# Patient Record
Sex: Female | Born: 1945 | Race: White | Hispanic: No | Marital: Married | Smoking: Former smoker
Health system: Southern US, Community
[De-identification: ages and names within clinical notes are randomized; demographics above are authoritative.]

## PROBLEM LIST (undated history)

## (undated) DIAGNOSIS — Z87442 Personal history of urinary calculi: Secondary | ICD-10-CM

## (undated) DIAGNOSIS — N390 Urinary tract infection, site not specified: Secondary | ICD-10-CM

## (undated) DIAGNOSIS — Z5189 Encounter for other specified aftercare: Secondary | ICD-10-CM

## (undated) DIAGNOSIS — I639 Cerebral infarction, unspecified: Secondary | ICD-10-CM

## (undated) DIAGNOSIS — R112 Nausea with vomiting, unspecified: Secondary | ICD-10-CM

## (undated) DIAGNOSIS — R51 Headache: Secondary | ICD-10-CM

## (undated) DIAGNOSIS — G4730 Sleep apnea, unspecified: Secondary | ICD-10-CM

## (undated) DIAGNOSIS — T8859XA Other complications of anesthesia, initial encounter: Secondary | ICD-10-CM

## (undated) DIAGNOSIS — R519 Headache, unspecified: Secondary | ICD-10-CM

## (undated) DIAGNOSIS — M1990 Unspecified osteoarthritis, unspecified site: Secondary | ICD-10-CM

## (undated) DIAGNOSIS — J189 Pneumonia, unspecified organism: Secondary | ICD-10-CM

## (undated) DIAGNOSIS — I499 Cardiac arrhythmia, unspecified: Secondary | ICD-10-CM

## (undated) DIAGNOSIS — T4145XA Adverse effect of unspecified anesthetic, initial encounter: Secondary | ICD-10-CM

## (undated) DIAGNOSIS — Z8489 Family history of other specified conditions: Secondary | ICD-10-CM

## (undated) DIAGNOSIS — N200 Calculus of kidney: Secondary | ICD-10-CM

## (undated) DIAGNOSIS — D649 Anemia, unspecified: Secondary | ICD-10-CM

## (undated) DIAGNOSIS — T7840XA Allergy, unspecified, initial encounter: Secondary | ICD-10-CM

## (undated) DIAGNOSIS — F419 Anxiety disorder, unspecified: Secondary | ICD-10-CM

## (undated) DIAGNOSIS — I4891 Unspecified atrial fibrillation: Secondary | ICD-10-CM

## (undated) DIAGNOSIS — H269 Unspecified cataract: Secondary | ICD-10-CM

## (undated) DIAGNOSIS — Z9889 Other specified postprocedural states: Secondary | ICD-10-CM

## (undated) DIAGNOSIS — I10 Essential (primary) hypertension: Secondary | ICD-10-CM

## (undated) DIAGNOSIS — E119 Type 2 diabetes mellitus without complications: Secondary | ICD-10-CM

## (undated) DIAGNOSIS — K219 Gastro-esophageal reflux disease without esophagitis: Secondary | ICD-10-CM

## (undated) DIAGNOSIS — E079 Disorder of thyroid, unspecified: Secondary | ICD-10-CM

## (undated) HISTORY — DX: Gastro-esophageal reflux disease without esophagitis: K21.9

## (undated) HISTORY — DX: Type 2 diabetes mellitus without complications: E11.9

## (undated) HISTORY — PX: DILATION AND CURETTAGE OF UTERUS: SHX78

## (undated) HISTORY — PX: TONSILLECTOMY: SUR1361

## (undated) HISTORY — DX: Anxiety disorder, unspecified: F41.9

## (undated) HISTORY — PX: TURBINATE RESECTION: SHX6158

## (undated) HISTORY — DX: Allergy, unspecified, initial encounter: T78.40XA

## (undated) HISTORY — PX: EXCISION MORTON'S NEUROMA: SHX5013

## (undated) HISTORY — PX: LITHOTRIPSY: SUR834

## (undated) HISTORY — PX: BREAST SURGERY: SHX581

## (undated) HISTORY — PX: EYE SURGERY: SHX253

## (undated) HISTORY — PX: SPINE SURGERY: SHX786

## (undated) HISTORY — DX: Unspecified cataract: H26.9

## (undated) HISTORY — DX: Sleep apnea, unspecified: G47.30

## (undated) HISTORY — PX: COSMETIC SURGERY: SHX468

## (undated) HISTORY — PX: ABDOMINAL HYSTERECTOMY: SHX81

## (undated) HISTORY — PX: LIPOMA EXCISION: SHX5283

## (undated) HISTORY — DX: Encounter for other specified aftercare: Z51.89

## (undated) HISTORY — DX: Disorder of thyroid, unspecified: E07.9

---

## 2004-08-12 ENCOUNTER — Emergency Department (HOSPITAL_COMMUNITY): Admission: EM | Admit: 2004-08-12 | Discharge: 2004-08-12 | Payer: Self-pay | Admitting: Family Medicine

## 2005-02-19 ENCOUNTER — Encounter: Admission: RE | Admit: 2005-02-19 | Discharge: 2005-02-19 | Payer: Self-pay | Admitting: Family Medicine

## 2005-03-03 ENCOUNTER — Encounter: Admission: RE | Admit: 2005-03-03 | Discharge: 2005-03-03 | Payer: Self-pay | Admitting: Family Medicine

## 2005-04-21 HISTORY — PX: JOINT REPLACEMENT: SHX530

## 2005-11-10 ENCOUNTER — Encounter: Admission: RE | Admit: 2005-11-10 | Discharge: 2005-11-10 | Payer: Self-pay | Admitting: Family Medicine

## 2006-01-22 ENCOUNTER — Encounter: Admission: RE | Admit: 2006-01-22 | Discharge: 2006-01-22 | Payer: Self-pay | Admitting: Otolaryngology

## 2006-03-05 ENCOUNTER — Encounter: Admission: RE | Admit: 2006-03-05 | Discharge: 2006-03-05 | Payer: Self-pay | Admitting: Family Medicine

## 2006-03-27 ENCOUNTER — Inpatient Hospital Stay (HOSPITAL_COMMUNITY): Admission: RE | Admit: 2006-03-27 | Discharge: 2006-03-31 | Payer: Self-pay | Admitting: Orthopedic Surgery

## 2007-03-16 ENCOUNTER — Encounter: Admission: RE | Admit: 2007-03-16 | Discharge: 2007-03-16 | Payer: Self-pay | Admitting: Family Medicine

## 2010-04-21 HISTORY — PX: BLADDER SUSPENSION: SHX72

## 2010-05-30 ENCOUNTER — Ambulatory Visit: Payer: Medicare Other | Attending: Sports Medicine | Admitting: Physical Therapy

## 2010-05-30 DIAGNOSIS — M545 Low back pain, unspecified: Secondary | ICD-10-CM | POA: Insufficient documentation

## 2010-05-30 DIAGNOSIS — IMO0001 Reserved for inherently not codable concepts without codable children: Secondary | ICD-10-CM | POA: Insufficient documentation

## 2010-05-30 DIAGNOSIS — R262 Difficulty in walking, not elsewhere classified: Secondary | ICD-10-CM | POA: Insufficient documentation

## 2010-05-30 DIAGNOSIS — R293 Abnormal posture: Secondary | ICD-10-CM | POA: Insufficient documentation

## 2010-06-04 ENCOUNTER — Ambulatory Visit: Payer: Medicare Other | Admitting: Physical Therapy

## 2010-06-07 ENCOUNTER — Ambulatory Visit: Payer: Medicare Other

## 2010-06-11 ENCOUNTER — Ambulatory Visit: Payer: Medicare Other | Admitting: Physical Therapy

## 2010-06-14 ENCOUNTER — Ambulatory Visit: Payer: Medicare Other

## 2010-06-17 ENCOUNTER — Ambulatory Visit: Payer: Medicare Other | Admitting: Physical Therapy

## 2010-06-21 ENCOUNTER — Ambulatory Visit: Payer: Medicare Other | Attending: Sports Medicine | Admitting: Physical Therapy

## 2010-06-21 DIAGNOSIS — M545 Low back pain, unspecified: Secondary | ICD-10-CM | POA: Insufficient documentation

## 2010-06-21 DIAGNOSIS — IMO0001 Reserved for inherently not codable concepts without codable children: Secondary | ICD-10-CM | POA: Insufficient documentation

## 2010-06-21 DIAGNOSIS — R262 Difficulty in walking, not elsewhere classified: Secondary | ICD-10-CM | POA: Insufficient documentation

## 2010-06-21 DIAGNOSIS — R293 Abnormal posture: Secondary | ICD-10-CM | POA: Insufficient documentation

## 2010-06-25 ENCOUNTER — Ambulatory Visit: Payer: Medicare Other | Admitting: Physical Therapy

## 2010-06-28 ENCOUNTER — Ambulatory Visit: Payer: Medicare Other | Admitting: Physical Therapy

## 2010-07-01 ENCOUNTER — Ambulatory Visit: Payer: Medicare Other | Admitting: Physical Therapy

## 2010-07-05 ENCOUNTER — Ambulatory Visit: Payer: Medicare Other

## 2010-07-08 ENCOUNTER — Ambulatory Visit: Payer: Medicare Other

## 2010-07-09 ENCOUNTER — Ambulatory Visit: Payer: Medicare Other | Admitting: Physical Therapy

## 2010-07-15 ENCOUNTER — Ambulatory Visit: Payer: Medicare Other | Admitting: Physical Therapy

## 2010-09-06 NOTE — Op Note (Signed)
NAMESANAIYA, WELLIVER                 ACCOUNT NO.:  [id number]   MEDICAL RECORD NO.:  [id number]          PATIENT TYPE:  INP   LOCATION:  0001                         FACILITY:  [facility]   PHYSICIAN:  Madlyn Frankel. Charlann Boxer, M.D.  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   PROCEDURE:  Right total hip replacement.   COMPONENTS USED:  DePuy hip system with a size 50 pinnacle cup, single  cancellous bone screw and a metal-on-metal 36 liner with a trial 11.3  lateralized offset stem with a 36 plus 1.5 ball.   SURGEON:  Madlyn Frankel. Charlann Boxer, M.D.   ASSISTANT:  Yetta Glassman. Loreta Ave, PA-C.   ANESTHESIA:  General.   ESTIMATED BLOOD LOSS:  150 mL.   DRAINS:  None.   COMPLICATIONS:  None.   INDICATIONS FOR PROCEDURE:  [name] is a 65 year old female who  presented to the office with advanced degenerative changes and failure  to respond to conservative measures. She had pain with all activities  limiting her quality of life. We discussed the risks and benefits of  nonoperative intervention at this point and she wished to proceed with  surgery. Consent was obtained after reviewing the risks of dislocation,  infection, DVT, neurologic damage, neurovascular injury.   DESCRIPTION OF PROCEDURE:  The patient was brought to the operative  theater. Once adequate anesthesia and preoperative antibiotics, 1 gram  of Ancef, was administered, the patient was positioned in the left  lateral decubitus position with the right side up. The right lower  extremity was then prepped and draped in sterile fashion following a  prescrub. A lateral based incision was made for posterior approach to  the hip. The  iliotibial band and gluteus fascia were incised in line  with the incision. She short external rotators were taken down  separately from the posterior capsule. An L capsulotomy was made in  order to expose the hip and protect the sciatic  nerve for anatomic  repair post procedure.   The hip was dislocated, a neck osteotomy was made from the center of the  head measuring 37 mm corresponding to the use of a lateralized offset  stem.   The neck osteotomy revealed severe degenerative changes of the hip with  flaps of cartilage on the head as well as a large loose body inside the  joint. Following this, femoral exposure was obtained using retractors. A  box osteotome was used to set anteversion. I then used a single axial  straight reamer, irrigated the canal to prevent fat emboli and then  began broaching. I broached all the way up to an 11.3 down to the  posterior neck cut where the measurement was taken with excellent  purchase. This was removed and the femoral canal packed.   The femur was then  retracted anteriorly and retractors for acetabular  exposure placed. Labrectomy was carried out. I then began reaming with a  45 reamer, carried it all the way up to a 49 with an excellent bony bed  preparation and a well-seated cup. She had a very neutral to almost  retroverted anatomic position and so the cup which was anteverted  forward flexed about 20 degrees and abducted about 35-40 degrees. It was  about 8 mm beneath the anterior rim.   I did place a single cancellous screw to support this excellent initial  scratch fit. A trial 36 neutral liner was placed. Attention was then  redirected back to the femur where this 11.3 broach was impacted down to  the neck cut. Trial reduction was carried out. I was very happy with the  stability of the hip and all range of motion. Her soft tissues appeared  to be lax enough that there was 2-3 mm of shuck with the hip in  extension but her hip was very  stable so I decided this going to be  length and did not need the lengthener based on the position of her leg  preop compared to now.   At this point, the trial components were all removed. The final central  hole eliminator was placed. The neutral metal on metal XL 50 mm 36  neutral metal liner was impacted into position and the final 11.3  lateralized offset stem was impacted down to the neck cut.   Following this, a trial reduction was again carried out with a 1.5 ball  and this was very good from a stability and length position as noted in  the trials.   The final 36, 1.5 ball was impacted on the clean and dry trunnion and  the hip reduced. The hip was irrigated throughout the case and again at  this point. I reapproximated the posterior capsule of the superior  leaflet using a #1 Ethibond. The #1 Ethibond was also utilized on the  iliotibial band. The gluteus fascia was reapproximated using #1 Vicryl.  The remainder of the wound was closed in layers with 2-0 Vicryl and a 4-  0 running Monocryl. The patient was brought to the recovery room in  stable condition, extubated.      Madlyn Frankel Charlann Boxer, M.D.  Electronically Signed     MDO/MEDQ  D:  03/27/2006  T:  03/27/2006  Job:  [id number]

## 2010-09-06 NOTE — H&P (Signed)
[name], [name]                 ACCOUNT NO.:  [id number]   MEDICAL RECORD NO.:  [id number]          PATIENT TYPE:   LOCATION:                                 FACILITY:   PHYSICIAN:  Connie Barnes, M.D.  DATE OF BIRTH:  1945-12-30   DATE OF ADMISSION:  03/27/2006  DATE OF DISCHARGE:                                HISTORY & PHYSICAL   Date of procedure will be March 27, 2006, for a total hip replacement.   CHIEF COMPLAINT:  Right hip pain.   HISTORY OF PRESENT ILLNESS:  [name] is a very pleasant, 65 year old lady  who was referred to [place] from Dr. __________ .  She has had a longstanding  history of right hip pain.  Prior evaluation revealed that she had advanced  degenerative joint disease with probable AVN and a medial head collapse.  The pain has progressed since 2001 and, at times, the pain is very sharp and  nearly intolerable.  She has tried some conservative measures which have  failed to relieve the pain and it seems very reasonable that she has elected  to proceed forward with a total hip replacement.   PAST HISTORY:  1. Hypertension.  2. Diverticulitis.  3. Migraine headaches.  4. Kidney stones.  5. Anxiety/depression.  6. Anemia.  7. Arthritis.   SURGICAL HISTORY:  Include:  1. Tonsillectomy in 1964.  2. Two D and C, 1968 and 1986.  3. Lipoma in 1994, removal.  4. Morton's neuroma in 2004.  5. Hysterectomy in 1986.  6. Lithotripsy 2003 and 2005.  7. Right breast biopsies in 1999.   SHE HAS HAD SOME SEVERE REACTION TO ANESTHESIA WHERE SHE CAN ACTUALLY GET  QUITE ABUSIVE VERBALLY SHE STATES UPON AWAKENING.   FAMILY HISTORY:  Significant for coronary artery disease, diabetes, and  cancer.   SOCIAL HISTORY:  She is a widow and retired and lives alone.  She did have  questions about her recovery and who was going to be there for recovery.  I  stated that it is very helpful to have somebody there to help you during the  first couple of weeks of rehab  and she objects to any kind of skilled  nursing facility.   DRUG ALLERGIES:  CIPRO CAUSES SWELLING.  TETRACYCLINE CAUSES YEAST  INFECTION.  MORPHINE CAUSES VOMITING.   MEDICATIONS:  1. Premarin 0.625 mg vaginal cream q. weekly.  2. TriCor/hydrochlorothiazide 37.5/25 p.o. daily.  3. Veramyst nasal solution daily.  4. Actonel 35 mg p.r.n.  5. An assortment of over-the-counter medications.   In the past 2 weeks, the patient states she has had no new signs or symptoms  of any cardiovascular, respiratory, genitourinary, gastrointestinal,  neurological, or musculoskeletal signs or symptoms.  Outbreak of eczema  which she is currently being treated for.   PHYSICAL EXAM:  Temperature 98.3.  Pulse 84.  Respirations 18.  Blood  pressure 134/74.  GENERAL:  Awake, alert, and oriented, well-developed, well-nourished in no  acute distress.  NECK:  Supple.  No carotid bruits.  No lymphadenopathy.  CHEST:  Lungs are clear  to auscultation bilaterally.  BREASTS:  Deferred.  HEART:  Regular rate and rhythm without gallops, clicks, rubs, or murmurs.  ABDOMEN:  Soft, nontender, nondistended.  Bowel sounds x4.  GENITOURINARY:  Deferred.  EXTREMITIES:  She walks with a Trendelenburg.  She has increased pain with  internal rotation and decreasing range of motion to 90.  SKIN:  No skin breakdown.  Eczema which is currently being treated for.  NEUROLOGIC:  Deep tendon reflexes 2+, intact distal sensibly and dorsalis  pedis pulse is intact.   Labs are pending her [name] appointment.  X-rays of pelvis and hip have  already been taken.  Chest x-ray pending preop.   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   Plan of action is a right total hip replacement by surgeon, Dr. Raylene Everts.  All risks and complications were discussed.  The patient is very  inquisitive, had many questions regarding the procedure treatment and  postoperative care.  All  questions were encouraged, answered, and reviewed  to the patient's satisfaction.  Was given the number for [name] for  preop medical work and also requested [name] from her primary  care physician.  Look forward to treating this very motivated, inquisitive,  and curious woman.     ______________________________  Connie Barnes. Connie Barnes, Connie Barnes      Connie Barnes, M.D.  Electronically Signed    BLM/MEDQ  D:  03/06/2006  T:  03/06/2006  Job:  [id number]

## 2010-09-06 NOTE — Consult Note (Signed)
NAME:  Connie Barnes, Connie Barnes                 ACCOUNT NO.:  [id number]   MEDICAL RECORD NO.:  [id number]          PATIENT TYPE:  INP   LOCATION:  1606                         FACILITY:  [facility]   PHYSICIAN:  Mobolaji B. Bakare, M.D.DATE OF BIRTH:  1945/05/16   DATE OF CONSULTATION:  03/31/2006  DATE OF DISCHARGE:                                 CONSULTATION   PRIMARY CARE PHYSICIAN:  Dr. Bradd Canary.   REASON FOR REFERRAL:  Left lower lobe pneumonia.   HISTORY OF PRESENTING COMPLAINT:  Connie Barnes is a pleasant 65 year old  Caucasian female who underwent right total hip replacement on the 7th of  December, 2007.  She was admitted on the same day.  Patient was well  prior to procedure and she is still well now.  She denies any shortness  of breath, cough or difficulty with breathing on exertion.  She has been  ambulating post procedure and she has not experienced any shortness of  breath.  There is no fever, temperature has been within normal except  for low grade temp of 99.2 and O2 sats have been normal, ranging between  95-97%.   Patient had an admission chest x-ray which showed a rounded density in  the medial right lung base, likely epicardial fat.  A CT scan was  recommended.  A follow up CT scan of the chest showed incidental finding  of left lower lobe pneumonia and mediastinal adenopathy.  Hence,  [facility] was called for recommendation regarding treatment.   REVIEW OF SYSTEMS:  As in the HPI.  In addition, patient has no  diarrhea.  She has been constipated but this seems to be improving now.  She has no significant pain at the site of surgery.  No headaches.   PAST MEDICAL HISTORY:  Significant for:  1. Hypertension.  2. Diverticulitis.  3. Migraine headaches.  4. Kidney stones.  5. Anxiety.  6. Depression.  7. Anemia.  8. Arthritis.   PAST SURGICAL HISTORY:  1. Right breast biopsy in 1999.  2. Lithotripsy in __________ and 2005.  3. Hysterectomy in 1986.  4. Removal of  lipoma in 1994.  5. D&C in 1968 and 1986.  6. Tonsillectomy in 1964.  7. Surgery for Morton's neuroma in 2004.   CURRENT MEDICATIONS:  1. Premarin 0.625 mg daily.  2. Colace 100 mg b.i.d.  3. Lovenox 40 mg daily.  4. Ferrous sulfate 325 mg t.i.d.  5. Premarin per vagina weekly.  6. Actonel 35 mg weekly.  7. Maxzide one tablet daily.   ALLERGIES:  1. SHE IS ALLERGIC TO CIPROFLOXACIN WITH HIVES.  2. MORPHINE CAUSES NAUSEA AND VOMITING.  3. TETRACYCLINE CAUSED YEAST INFECTION.  4. TALWIN GAVE HER HALLUCINATIONS.   SOCIAL HISTORY:  Patient is a widow and she lives alone.  She smoked  cigarettes 1/2 to 3/4 pack per day and she smoked for 15-20 years.  She  quit smoking 3 years ago.  Patient occasionally drinks alcohol about 2-3  glasses per week.   CURRENT VITALS:  Temperature 99.2, pulse of 77, respiratory rate of 20,  blood pressure of 137/67,  O2 sats of 95% on room air.   EXAMINATION:  Patient is comfortable, not in respiratory distress.  Normocephalic, atraumatic head.  Pupils equal, round and reactive to  light.  Extraocular muscle movements intact.  Mucous membrane is moist,  no oral thrush.  LUNGS:  Clear clinically to auscultation.  CVS:  S1, S2 regular, no murmur, no gallop.  ABDOMEN:  Not distended, soft, nontender.  Bowel sounds present.  EXTREMITIES:  No pedal edema.  MUSCULOSKELETAL:  Bruise right hip surgical site.  CNS:  No focal neurological deficit.   LABORATORY DATA:  Hemoglobin and hematocrit 10.8/31.  No current CBC  available.  CT scan of the chest shows right cardiophrenic angle opacity  noted on previous chest x-ray representing benign epicardial fat pad.  Incidental finding of left lower lobe pneumonia and mild mediastinal  adenopathy.   ASSESSMENT AND RECOMMENDATION:  Connie Barnes is a 65 year old Caucasian  female who was hospitalized on the 7th of December.  She had a CT scan  to further evaluate chest x-ray finding of rounded density in the medial   lung base.  The CT scan showed incidental finding of left lower lobe  pneumonia.  It is hard to tell if this was pre-hospitalization or  hospital acquired pneumonia, as patient did not have symptoms prior to  hospitalization and currently has no symptoms.  She is suitable for  outpatient treatment of pneumonia.  She would have benefited from  fluoroquinolone which would cover Pseudomonas as well.  However, PATIENT  IS ALLERGIC TO CIPRO.  Will treat her with high dose Augmentin 875 mg by  mouth twice daily for one week.  I have instructed her to call Dr. Artis Flock  if she develops fever, chills or becomes symptomatic.  She can also come  to the emergency room.  Will draw  baseline CBC and differential and also two sets of blood cultures.  Patient can be discharged home today at the discretion of Dr. Charlann Boxer.  She  can be treated for pneumonia as an outpatient.   Thank you for the consult.      Mobolaji B. Corky Downs, M.D.  Electronically Signed     MBB/MEDQ  D:  03/31/2006  T:  03/31/2006  Job:  [id number]   cc:   Madlyn Frankel. Charlann Boxer, M.D.  Fax: [phone]   Quita Skye. Artis Flock, M.D.  Fax: [phone]

## 2010-09-06 NOTE — Discharge Summary (Signed)
NAMEHELVI, ROYALS                 ACCOUNT NO.:  [id number]   MEDICAL RECORD NO.:  [id number]          PATIENT TYPE:  INP   LOCATION:  1606                         [facility]ACILITY:  [facility]   PHYSICIAN:  Madlyn Frankel. Charlann Boxer, M.D.  DATE OF BIRTH:  [birth year]   DATE OF ADMISSION:  03/27/2006  DATE OF DISCHARGE:  03/31/2006                               DISCHARGE SUMMARY   ADMISSION DIAGNOSIS:  1. Hypertension.  2. Diverticulitis.  3. Migraines.  4. Kidney stones.  5. Anxiety and depression.  6. Anemia.  7. Osteoarthritis.   DISCHARGE DIAGNOSIS:  1. Hypertension.  2. Diverticulitis.  3. Migraines.  4. Kidney stones.  5. Anxiety and depression.  6. Anemia.  7. Osteoarthritis.  8. Postoperative anemia.  9. Left lower lobe pneumonia.   PAST SURGICAL HISTORY:  1. Tonsillectomy in 1964.  2. D&C in 1962 and 1986.  3. Lipoma in 1994 removal.  4. Morton's neuroma in 2004.  5. Hysterectomy in 1986.  6. Lithotripsy in 2003 and 2005.  7. Right breast biopsy in 1999.   CONSULTATIONS:  [facility] Hospitalist due to mass found on CT and left  lower lobe.  She was seen by [facility] who treated her for  her left lower lobe pneumonia.   Procedure was a right total hip replacement.  Surgeon was Dr. Durene Romans.  Assistant was [facility], [place].   LABORATORY DATA:  Preadmission CBC showed hemoglobin 13.9, hematocrit  40.8 and dropped throughout her course of stay hemoglobin 9.5,  hematocrit 27.4.  On  March 29, 2006 hemoglobin 8.7, hematocrit 24.9.  On March 30, 2006 hemoglobin 7.9, hematocrit 22.4.  She was  transfused two units of packed red blood cells.  Subsequent checking of  her blood showed her hemoglobin and hematocrit 10.8 and 31 upon  discharge.  Hemoglobin and hematocrit was 11.2 and 32.6 respectively.  Preadmission white cell differential within normal limits.  Differential  upon discharge within normal limits.  Preadmission coagulation within  normal limits.   Preadmission routine chemistry showed sodium 142,  potassium 3.3, glucose 129.  Upon discharge sodium 140, potassium 3.4,  glucose 118.  Preadmission urinalysis negative.   DIAGNOSTIC DATA:  EKG showed normal sinus rhythm.  Chest two view  presurgical showed a rounded density in the medial right lung base  likely epicardial fat but recommended chest CT for confirmation.  Otherwise lungs were clear.  Heart size was normal.  Subsequent  postoperative pelvis showed satisfactory right hip placement.  CT scan  postoperatively showed a left lower lobe pneumonia.   HOSPITAL COURSE:  The patient had right total hip replacement and  tolerated the procedure well and was admitted to the orthopedic floor.  On postoperative day #1 was doing well.  No respiratory distress.  The  pain was well controlled.  Oral antibiotics were given.  Dressing was  clean, dry, and intact.  She was neuromuscularly and vascularly intact.  Physical therapy and occupational therapy and was to be weightbearing as  tolerated with use of rolling walker.   On postoperative day #2 the patient was doing  well.  Hemodynamically  remaining stable.  Dressing was clean, dry, and intact.  She had bowel  movement after postoperative day #2.  Postoperative day #3 woke up and  the patient's hemoglobin was 7.9.  Therefore, she was transfused two  units of packed red blood cells so unable to discharge postoperative day  #3 as we were waiting for normalization of her packed red blood cells.  A CT was performed.  By postoperative day #4 results showed a left lower  lobe pneumonia where we called in for a consult from [facility] who came and saw the patient.  [facility] treated the  patient for a left lower lobe pneumonia and provided a prescription for  Augmentin.  Otherwise, orthopedically she was stable.  She did have some  ecchymosis due to the Lovenox which she was taking for anticoagulation.  Otherwise she was  orthopedically stable and ready to be discharged home  with [facility].   DISPOSITION:  Discharged home stable in improved condition.   DISCHARGE PHYSICAL THERAPY:  She will be weightbearing as tolerated with  the use of a rolling walker.  We will work on upper body strength and  gait retraining.  We will want to work on maximizing range of motion and  minimizing pain, encouraging activities of independence and better  living.   DISCHARGE WOUND CARE:  Keep wound dry, may shower.  However, change  dressings on a daily basis.   DISCHARGE FOLLOW UP:  Followup with Dr. Charlann Boxer in approximately two weeks.   DISCHARGE MEDICATIONS:  1. L1 Premarin 0.625 mg vaginal cream q weekly.  2. Tricor-Hyzaar-HCTZ 37.5/25 one PO q daily.  3. Veramyst nasal solution daily.  4. Actonel 35 mg prn.  5. Lovenox 40 mg once q times 10 additional days q 24.  6. Norco 5/325 one-to-two PO 4-6 prn pain.  7. Robaxin 500 mg 1-2 q 4-6 prn muscle spasm pain.  8. Colace 100 mg one PO two times a day prn constipation.  9. MiraLax 17 g one scoop daily prn constipation.  10.She would also need to followup with her primary care physician,      Dr. Penni Bombard, for followup of the left lower lobe pneumonia where      she was prescribed Augmentin 875 mg one PO two times a day times      seven days.     ______________________________  Yetta Glassman Loreta Ave, [place]      Madlyn Frankel. Charlann Boxer, M.D.  Electronically Signed    BLM/MEDQ  D:  04/23/2006  T:  04/23/2006  Job:  [id number]

## 2010-12-04 ENCOUNTER — Encounter: Payer: Self-pay | Admitting: *Deleted

## 2010-12-04 ENCOUNTER — Emergency Department (INDEPENDENT_AMBULATORY_CARE_PROVIDER_SITE_OTHER): Payer: Medicare Other

## 2010-12-04 ENCOUNTER — Other Ambulatory Visit: Payer: Self-pay

## 2010-12-04 ENCOUNTER — Emergency Department (HOSPITAL_BASED_OUTPATIENT_CLINIC_OR_DEPARTMENT_OTHER)
Admission: EM | Admit: 2010-12-04 | Discharge: 2010-12-04 | Disposition: A | Discharge status: Home / Self Care | Payer: Medicare Other | Attending: Emergency Medicine | Admitting: Emergency Medicine

## 2010-12-04 DIAGNOSIS — R1032 Left lower quadrant pain: Secondary | ICD-10-CM | POA: Insufficient documentation

## 2010-12-04 DIAGNOSIS — N200 Calculus of kidney: Secondary | ICD-10-CM

## 2010-12-04 DIAGNOSIS — Q619 Cystic kidney disease, unspecified: Secondary | ICD-10-CM

## 2010-12-04 DIAGNOSIS — I10 Essential (primary) hypertension: Secondary | ICD-10-CM | POA: Insufficient documentation

## 2010-12-04 DIAGNOSIS — R1031 Right lower quadrant pain: Secondary | ICD-10-CM | POA: Insufficient documentation

## 2010-12-04 DIAGNOSIS — R079 Chest pain, unspecified: Secondary | ICD-10-CM

## 2010-12-04 DIAGNOSIS — K5732 Diverticulitis of large intestine without perforation or abscess without bleeding: Secondary | ICD-10-CM

## 2010-12-04 DIAGNOSIS — J9819 Other pulmonary collapse: Secondary | ICD-10-CM

## 2010-12-04 DIAGNOSIS — I708 Atherosclerosis of other arteries: Secondary | ICD-10-CM

## 2010-12-04 DIAGNOSIS — R109 Unspecified abdominal pain: Secondary | ICD-10-CM | POA: Insufficient documentation

## 2010-12-04 HISTORY — DX: Essential (primary) hypertension: I10

## 2010-12-04 HISTORY — DX: Unspecified atrial fibrillation: I48.91

## 2010-12-04 LAB — TROPONIN I: Troponin I: <0.3 ng/mL (ref <0.30)

## 2010-12-04 LAB — COMPREHENSIVE METABOLIC PANEL
ALT: 17 U/L (ref 0–35)
AST: 31 U/L (ref 0–37)
Albumin: 3.5 g/dL (ref 3.5–5.2)
Alkaline Phosphatase: 82 U/L (ref 39–117)
BUN: 17 mg/dL (ref 6–23)
CO2: 29 mEq/L (ref 19–32)
Calcium: 9.7 mg/dL (ref 8.4–10.5)
Chloride: 103 mEq/L (ref 96–112)
GFR calc Af Amer: >60 mL/min (ref >60)
Glucose, Bld: 88 mg/dL (ref 70–99)
Potassium: 4.4 mEq/L (ref 3.5–5.1)
Sodium: 141 mEq/L (ref 135–145)
Total Bilirubin: 0.3 mg/dL (ref 0.3–1.2)
Total Protein: 7.1 g/dL (ref 6.0–8.3)

## 2010-12-04 LAB — CBC
Hemoglobin: 13.1 g/dL (ref 12.0–15.0)
MCH: 31.8 pg (ref 26.0–34.0)
MCHC: 34.4 g/dL (ref 30.0–36.0)
MCV: 92.5 fL (ref 78.0–100.0)
Platelets: 192 10*3/uL (ref 150–400)
RBC: 4.12 MIL/uL (ref 3.87–5.11)
RDW: 12.4 % (ref 11.5–15.5)
WBC: 7.1 10*3/uL (ref 4.0–10.5)

## 2010-12-04 LAB — DIFFERENTIAL
Basophils Relative: 0 % (ref 0–1)
Eosinophils Absolute: 0.3 10*3/uL (ref 0.0–0.7)
Eosinophils Relative: 4 % (ref 0–5)
Lymphocytes Relative: 29 % (ref 12–46)
Monocytes Relative: 12 % (ref 3–12)
Neutro Abs: 3.9 10*3/uL (ref 1.7–7.7)
Neutrophils Relative %: 55 % (ref 43–77)

## 2010-12-04 LAB — D-DIMER, QUANTITATIVE: D-Dimer, Quant: 1.15 ug/mL-FEU — ABNORMAL HIGH (ref 0.00–0.48)

## 2010-12-04 LAB — LIPASE, BLOOD: Lipase: 24 U/L (ref 11–59)

## 2010-12-04 MED ORDER — METRONIDAZOLE 500 MG PO TABS: 500.0000 mg | ORAL_TABLET | Freq: Two times a day (BID) | ORAL | Status: AC

## 2010-12-04 MED ORDER — IOHEXOL 350 MG/ML SOLN
80.0000 mL | Freq: Once | INTRAVENOUS | Status: AC | PRN
  Administered 2010-12-04: 80 mL via INTRAVENOUS

## 2010-12-04 MED ORDER — ASPIRIN 81 MG PO CHEW
324.0000 mg | CHEWABLE_TABLET | Freq: Once | ORAL | Status: AC
  Administered 2010-12-04: 324 mg via ORAL
  Filled 2010-12-04: qty 4

## 2010-12-04 MED ORDER — HYDROCODONE-ACETAMINOPHEN 5-325 MG PO TABS: 1.0000 | ORAL_TABLET | ORAL | Status: AC | PRN

## 2010-12-04 MED ORDER — FLUCONAZOLE 200 MG PO TABS: 150.0000 mg | ORAL_TABLET | Freq: Once | ORAL | Status: DC

## 2010-12-04 MED ORDER — AMOXICILLIN-POT CLAVULANATE 875-125 MG PO TABS: 1.0000 | ORAL_TABLET | Freq: Two times a day (BID) | ORAL | Status: AC

## 2010-12-04 MED ORDER — FLUCONAZOLE 50 MG PO TABS
150.0000 mg | ORAL_TABLET | Freq: Once | ORAL | Status: AC
  Administered 2010-12-04: 150 mg via ORAL
  Filled 2010-12-04: qty 1

## 2010-12-04 NOTE — ED Notes (Signed)
Pt drinking oral contrast.

## 2010-12-04 NOTE — ED Provider Notes (Addendum)
History     CSN: [id number] Arrival date & time: 12/04/2010  3:32 PM  Chief Complaint  Patient presents with  . Abdominal Pain   HPI  Pt has had abd pain x 3 days. Pain described as sharp, sometimes in RLQ w/ radiation to groin and sometimes starting in LLQ and radiating to RLQ and upper abdomen.  Has had constipation but relieved w/ docusate sodium and suppository and having a BM improved pain.  No associated f/c, N/V/D, blood in stool, GU symptoms.  Also has pain in right flank.  Prior abd surgeries include partial hysterectomy.  H/o kidney stones but this feels different.  Also c/o intermittent, brief, sharp pain in right chest since this morning.  Non-pleuritic and no associated cough, SOB, LE pain/edema.  Risk factors for PE include HRT and recent travel.  No personal or FH of MI and does not smoke cigarettes.   Past Medical History  Diagnosis Date  . Kidney stones   . Hypertension   . Atrial fibrillation     Past Surgical History  Procedure Date  . Abdominal hysterectomy     No family history on file.  History  Substance Use Topics  . Smoking status: Former Games developer  . Smokeless tobacco: Not on file  . Alcohol Use: Yes     twice a week    OB History    Grav Para Term Preterm Abortions TAB SAB Ect Mult Living                  Review of Systems  All other systems reviewed and are negative.    Physical Exam  BP 156/59  Pulse 67  Temp(Src) 98.4 F (36.9 C) (Oral)  Resp 20  SpO2 97%  Physical Exam  Nursing note and vitals reviewed. Constitutional: She is oriented to person, place, and time. She appears well-developed and well-nourished. No distress.  HENT:  Head: Normocephalic and atraumatic.  Eyes:       Normal appearance  Neck: Normal range of motion.  Cardiovascular: Normal rate and regular rhythm.   Pulmonary/Chest: Effort normal and breath sounds normal.       Tenderness at RSB.  No pleuritic pain reported.  Abdominal: Soft. Bowel sounds are normal.  She exhibits no distension and no mass. There is no rebound and no guarding.       Diffuse lower abdominal as well as LUQ ttp.  Tenderness most severe mid-line lower abd.  No CVA tenderness  Musculoskeletal: She exhibits no edema and no tenderness.  Neurological: She is alert and oriented to person, place, and time.  Skin: Skin is warm and dry. No rash noted.  Psychiatric: She has a normal mood and affect. Her behavior is normal.    Date: 12/04/2010  Rate: 69  Rhythm: sinus arrhythmia and premature atrial contractions (PAC)  QRS Axis: normal  Intervals: normal  ST/T Wave abnormalities: normal  Conduction Disutrbances:  None  Narrative Interpretation:   Old EKG Reviewed: changes noted PACs   ED Course  Procedures  MDM Pt presents w/ abd pain w/ associated nausea x 3 days.  Abd benign, diffuse lower abd as well as LUQ ttp, no pulsatile mass or CVA ttp.  Labs and CT abd/pelvis pending.  Also c/o sharp, non-exertional, non-positional, non-pleuritic CP.  Low risk for MI, EKG w/out signs of ischemia, troponin pending.  Risk factors for PE include HRT and recent travel.  No signs suggestive of PE on exam.  D-dimer pending.  Pt declines pain/nausea  medication at this time.   Troponin neg. D-dimer elevated.  CT angio chest ordered.  Labs otherwise unremarkable 5:00 PM   CT angio chest neg.  CT abd shows right-sided diverticulitis. Results discussed w/ pt.  Pt prescribed flagyl and Augmentin (cipro allergy).  Received one dose of diflucan in ED (susceptible to yeast infections) and prescribed a single dose to take in one week if symptomatic as well.  Also prescribed pain medication.  Advised to stick to clear fluids for two days.  She is tolerating pos.  Return precautions discussed.  She has a PCP and GI physician to f/u with.   Otilio Miu, PA 12/04/10 2243  Otilio Miu, PA 12/04/10 2245  Hilario Quarry, MD 12/07/10 1610  Hilario Quarry, MD 12/07/10 [phone]

## 2010-12-04 NOTE — ED Notes (Signed)
Upper abd pain into her right lower quad and into her back x 3 days. Right sided chest pain since this am.

## 2012-04-21 HISTORY — PX: SHOULDER ARTHROSCOPY: SHX128

## 2013-04-06 ENCOUNTER — Ambulatory Visit: Payer: Medicare Other | Attending: Physician Assistant | Admitting: Physical Therapy

## 2013-04-06 DIAGNOSIS — IMO0001 Reserved for inherently not codable concepts without codable children: Secondary | ICD-10-CM | POA: Insufficient documentation

## 2013-04-06 DIAGNOSIS — R609 Edema, unspecified: Secondary | ICD-10-CM | POA: Insufficient documentation

## 2013-04-06 DIAGNOSIS — M25619 Stiffness of unspecified shoulder, not elsewhere classified: Secondary | ICD-10-CM | POA: Insufficient documentation

## 2013-04-06 DIAGNOSIS — M25519 Pain in unspecified shoulder: Secondary | ICD-10-CM | POA: Insufficient documentation

## 2013-04-06 DIAGNOSIS — I10 Essential (primary) hypertension: Secondary | ICD-10-CM | POA: Insufficient documentation

## 2013-04-06 DIAGNOSIS — Z96649 Presence of unspecified artificial hip joint: Secondary | ICD-10-CM | POA: Insufficient documentation

## 2013-04-07 ENCOUNTER — Ambulatory Visit: Payer: Medicare Other | Admitting: Rehabilitation

## 2013-04-12 ENCOUNTER — Ambulatory Visit: Payer: Medicare Other | Admitting: Physical Therapy

## 2013-04-13 ENCOUNTER — Ambulatory Visit: Payer: Medicare Other | Admitting: Rehabilitation

## 2013-04-13 ENCOUNTER — Encounter (INDEPENDENT_AMBULATORY_CARE_PROVIDER_SITE_OTHER): Payer: Self-pay

## 2013-04-18 ENCOUNTER — Encounter: Payer: Medicare Other | Admitting: Physical Therapy

## 2013-04-19 ENCOUNTER — Ambulatory Visit: Payer: Medicare Other | Admitting: Physical Therapy

## 2013-04-20 ENCOUNTER — Ambulatory Visit: Payer: Medicare Other | Admitting: Physical Therapy

## 2013-04-22 ENCOUNTER — Ambulatory Visit: Payer: Medicare Other | Attending: Physician Assistant | Admitting: Rehabilitation

## 2013-04-22 DIAGNOSIS — R609 Edema, unspecified: Secondary | ICD-10-CM | POA: Insufficient documentation

## 2013-04-22 DIAGNOSIS — Z96649 Presence of unspecified artificial hip joint: Secondary | ICD-10-CM | POA: Insufficient documentation

## 2013-04-22 DIAGNOSIS — M25619 Stiffness of unspecified shoulder, not elsewhere classified: Secondary | ICD-10-CM | POA: Insufficient documentation

## 2013-04-22 DIAGNOSIS — IMO0001 Reserved for inherently not codable concepts without codable children: Secondary | ICD-10-CM | POA: Insufficient documentation

## 2013-04-22 DIAGNOSIS — M25519 Pain in unspecified shoulder: Secondary | ICD-10-CM | POA: Insufficient documentation

## 2013-04-25 ENCOUNTER — Encounter: Payer: Medicare Other | Admitting: Rehabilitation

## 2013-07-14 ENCOUNTER — Ambulatory Visit: Payer: Medicare Other | Attending: Physician Assistant | Admitting: Rehabilitation

## 2013-07-14 DIAGNOSIS — M4120 Other idiopathic scoliosis, site unspecified: Secondary | ICD-10-CM | POA: Insufficient documentation

## 2013-07-14 DIAGNOSIS — IMO0001 Reserved for inherently not codable concepts without codable children: Secondary | ICD-10-CM | POA: Insufficient documentation

## 2013-07-14 DIAGNOSIS — M549 Dorsalgia, unspecified: Secondary | ICD-10-CM | POA: Insufficient documentation

## 2013-07-19 ENCOUNTER — Ambulatory Visit: Payer: Medicare Other | Admitting: Rehabilitation

## 2013-07-21 ENCOUNTER — Ambulatory Visit: Payer: Medicare Other | Attending: Physician Assistant | Admitting: Rehabilitation

## 2013-07-21 DIAGNOSIS — Z96649 Presence of unspecified artificial hip joint: Secondary | ICD-10-CM | POA: Insufficient documentation

## 2013-07-21 DIAGNOSIS — M25519 Pain in unspecified shoulder: Secondary | ICD-10-CM | POA: Insufficient documentation

## 2013-07-21 DIAGNOSIS — IMO0001 Reserved for inherently not codable concepts without codable children: Secondary | ICD-10-CM | POA: Insufficient documentation

## 2013-07-21 DIAGNOSIS — R609 Edema, unspecified: Secondary | ICD-10-CM | POA: Insufficient documentation

## 2013-07-21 DIAGNOSIS — M25619 Stiffness of unspecified shoulder, not elsewhere classified: Secondary | ICD-10-CM | POA: Insufficient documentation

## 2013-07-25 ENCOUNTER — Ambulatory Visit: Payer: Medicare Other | Admitting: Rehabilitation

## 2013-07-28 ENCOUNTER — Ambulatory Visit: Payer: Medicare Other | Admitting: Rehabilitation

## 2013-08-02 ENCOUNTER — Ambulatory Visit: Payer: Medicare Other | Admitting: Rehabilitation

## 2013-08-05 ENCOUNTER — Ambulatory Visit: Payer: Medicare Other | Admitting: Rehabilitation

## 2013-08-08 ENCOUNTER — Ambulatory Visit: Payer: Medicare Other | Admitting: Rehabilitation

## 2013-08-10 ENCOUNTER — Ambulatory Visit: Payer: Medicare Other | Admitting: Rehabilitation

## 2013-08-12 ENCOUNTER — Ambulatory Visit: Payer: Medicare Other | Admitting: Rehabilitation

## 2013-08-24 ENCOUNTER — Ambulatory Visit: Payer: Medicare Other | Attending: Specialist | Admitting: Rehabilitation

## 2013-08-24 DIAGNOSIS — M25519 Pain in unspecified shoulder: Secondary | ICD-10-CM | POA: Insufficient documentation

## 2013-08-24 DIAGNOSIS — M25619 Stiffness of unspecified shoulder, not elsewhere classified: Secondary | ICD-10-CM | POA: Insufficient documentation

## 2013-08-24 DIAGNOSIS — IMO0001 Reserved for inherently not codable concepts without codable children: Secondary | ICD-10-CM | POA: Insufficient documentation

## 2013-08-25 ENCOUNTER — Ambulatory Visit: Payer: Medicare Other | Admitting: Rehabilitation

## 2013-08-25 DIAGNOSIS — IMO0001 Reserved for inherently not codable concepts without codable children: Secondary | ICD-10-CM | POA: Diagnosis not present

## 2013-08-25 DIAGNOSIS — M25519 Pain in unspecified shoulder: Secondary | ICD-10-CM | POA: Diagnosis not present

## 2013-08-25 DIAGNOSIS — M25619 Stiffness of unspecified shoulder, not elsewhere classified: Secondary | ICD-10-CM | POA: Diagnosis not present

## 2013-08-26 ENCOUNTER — Ambulatory Visit: Payer: Medicare Other | Admitting: Rehabilitation

## 2013-08-26 DIAGNOSIS — IMO0001 Reserved for inherently not codable concepts without codable children: Secondary | ICD-10-CM | POA: Diagnosis not present

## 2013-08-30 ENCOUNTER — Ambulatory Visit: Payer: Medicare Other | Admitting: Rehabilitation

## 2013-08-30 DIAGNOSIS — IMO0001 Reserved for inherently not codable concepts without codable children: Secondary | ICD-10-CM | POA: Diagnosis not present

## 2013-09-01 ENCOUNTER — Ambulatory Visit: Payer: Medicare Other | Admitting: Rehabilitation

## 2013-09-01 DIAGNOSIS — IMO0001 Reserved for inherently not codable concepts without codable children: Secondary | ICD-10-CM | POA: Diagnosis not present

## 2013-09-06 ENCOUNTER — Ambulatory Visit: Payer: Medicare Other | Admitting: Rehabilitation

## 2013-09-06 DIAGNOSIS — IMO0001 Reserved for inherently not codable concepts without codable children: Secondary | ICD-10-CM | POA: Diagnosis not present

## 2013-09-08 ENCOUNTER — Ambulatory Visit: Payer: Medicare Other | Admitting: Rehabilitation

## 2013-09-08 DIAGNOSIS — IMO0001 Reserved for inherently not codable concepts without codable children: Secondary | ICD-10-CM | POA: Diagnosis not present

## 2013-09-14 ENCOUNTER — Ambulatory Visit: Payer: Medicare Other | Admitting: Rehabilitation

## 2013-09-14 DIAGNOSIS — IMO0001 Reserved for inherently not codable concepts without codable children: Secondary | ICD-10-CM | POA: Diagnosis not present

## 2013-09-15 ENCOUNTER — Ambulatory Visit: Payer: Medicare Other | Admitting: Rehabilitation

## 2013-09-15 DIAGNOSIS — IMO0001 Reserved for inherently not codable concepts without codable children: Secondary | ICD-10-CM | POA: Diagnosis not present

## 2013-09-20 ENCOUNTER — Ambulatory Visit: Payer: Medicare Other | Attending: Specialist | Admitting: Rehabilitation

## 2013-09-20 DIAGNOSIS — IMO0001 Reserved for inherently not codable concepts without codable children: Secondary | ICD-10-CM | POA: Insufficient documentation

## 2013-09-20 DIAGNOSIS — M25519 Pain in unspecified shoulder: Secondary | ICD-10-CM | POA: Insufficient documentation

## 2013-09-20 DIAGNOSIS — M25619 Stiffness of unspecified shoulder, not elsewhere classified: Secondary | ICD-10-CM | POA: Insufficient documentation

## 2013-09-21 ENCOUNTER — Ambulatory Visit: Payer: Medicare Other | Admitting: Rehabilitation

## 2013-09-21 DIAGNOSIS — IMO0001 Reserved for inherently not codable concepts without codable children: Secondary | ICD-10-CM | POA: Diagnosis not present

## 2013-09-23 ENCOUNTER — Ambulatory Visit: Payer: Medicare Other | Admitting: Rehabilitation

## 2013-09-23 DIAGNOSIS — IMO0001 Reserved for inherently not codable concepts without codable children: Secondary | ICD-10-CM | POA: Diagnosis not present

## 2013-09-27 ENCOUNTER — Ambulatory Visit: Payer: Medicare Other | Admitting: Rehabilitation

## 2013-09-27 DIAGNOSIS — IMO0001 Reserved for inherently not codable concepts without codable children: Secondary | ICD-10-CM | POA: Diagnosis not present

## 2013-09-28 ENCOUNTER — Ambulatory Visit: Payer: Medicare Other | Admitting: Rehabilitation

## 2013-09-28 DIAGNOSIS — IMO0001 Reserved for inherently not codable concepts without codable children: Secondary | ICD-10-CM | POA: Diagnosis not present

## 2013-09-30 ENCOUNTER — Ambulatory Visit: Payer: Medicare Other | Admitting: Rehabilitation

## 2013-09-30 DIAGNOSIS — IMO0001 Reserved for inherently not codable concepts without codable children: Secondary | ICD-10-CM | POA: Diagnosis not present

## 2013-10-05 ENCOUNTER — Ambulatory Visit: Payer: Medicare Other | Admitting: Rehabilitation

## 2013-10-05 DIAGNOSIS — IMO0001 Reserved for inherently not codable concepts without codable children: Secondary | ICD-10-CM | POA: Diagnosis not present

## 2013-10-07 ENCOUNTER — Ambulatory Visit: Payer: Medicare Other | Admitting: Rehabilitation

## 2013-10-07 DIAGNOSIS — R300 Dysuria: Secondary | ICD-10-CM | POA: Insufficient documentation

## 2013-10-07 DIAGNOSIS — J069 Acute upper respiratory infection, unspecified: Secondary | ICD-10-CM | POA: Insufficient documentation

## 2013-10-07 DIAGNOSIS — M722 Plantar fascial fibromatosis: Secondary | ICD-10-CM | POA: Insufficient documentation

## 2013-10-07 DIAGNOSIS — E781 Pure hyperglyceridemia: Secondary | ICD-10-CM | POA: Insufficient documentation

## 2013-10-07 DIAGNOSIS — E876 Hypokalemia: Secondary | ICD-10-CM | POA: Insufficient documentation

## 2013-10-07 DIAGNOSIS — R509 Fever, unspecified: Secondary | ICD-10-CM | POA: Insufficient documentation

## 2013-10-07 DIAGNOSIS — I4891 Unspecified atrial fibrillation: Secondary | ICD-10-CM | POA: Insufficient documentation

## 2013-10-07 DIAGNOSIS — H9190 Unspecified hearing loss, unspecified ear: Secondary | ICD-10-CM | POA: Insufficient documentation

## 2013-10-07 DIAGNOSIS — K5730 Diverticulosis of large intestine without perforation or abscess without bleeding: Secondary | ICD-10-CM | POA: Insufficient documentation

## 2013-10-07 DIAGNOSIS — IMO0001 Reserved for inherently not codable concepts without codable children: Secondary | ICD-10-CM | POA: Diagnosis not present

## 2013-10-07 DIAGNOSIS — K648 Other hemorrhoids: Secondary | ICD-10-CM | POA: Insufficient documentation

## 2013-10-07 DIAGNOSIS — N951 Menopausal and female climacteric states: Secondary | ICD-10-CM | POA: Insufficient documentation

## 2013-10-07 DIAGNOSIS — D72829 Elevated white blood cell count, unspecified: Secondary | ICD-10-CM | POA: Insufficient documentation

## 2013-10-07 DIAGNOSIS — R209 Unspecified disturbances of skin sensation: Secondary | ICD-10-CM | POA: Insufficient documentation

## 2013-10-07 DIAGNOSIS — E559 Vitamin D deficiency, unspecified: Secondary | ICD-10-CM | POA: Insufficient documentation

## 2013-10-07 DIAGNOSIS — I4892 Unspecified atrial flutter: Secondary | ICD-10-CM | POA: Insufficient documentation

## 2013-10-07 DIAGNOSIS — Z23 Encounter for immunization: Secondary | ICD-10-CM | POA: Insufficient documentation

## 2013-10-07 DIAGNOSIS — R8281 Pyuria: Secondary | ICD-10-CM | POA: Insufficient documentation

## 2013-10-07 DIAGNOSIS — D126 Benign neoplasm of colon, unspecified: Secondary | ICD-10-CM | POA: Insufficient documentation

## 2013-10-07 DIAGNOSIS — M79673 Pain in unspecified foot: Secondary | ICD-10-CM | POA: Insufficient documentation

## 2013-10-07 DIAGNOSIS — E785 Hyperlipidemia, unspecified: Secondary | ICD-10-CM | POA: Insufficient documentation

## 2013-10-07 DIAGNOSIS — R310 Gross hematuria: Secondary | ICD-10-CM | POA: Insufficient documentation

## 2013-10-07 DIAGNOSIS — I10 Essential (primary) hypertension: Secondary | ICD-10-CM | POA: Insufficient documentation

## 2013-10-07 DIAGNOSIS — M79609 Pain in unspecified limb: Secondary | ICD-10-CM | POA: Insufficient documentation

## 2013-10-07 DIAGNOSIS — H6120 Impacted cerumen, unspecified ear: Secondary | ICD-10-CM | POA: Insufficient documentation

## 2013-10-07 DIAGNOSIS — K921 Melena: Secondary | ICD-10-CM | POA: Insufficient documentation

## 2013-10-07 DIAGNOSIS — L259 Unspecified contact dermatitis, unspecified cause: Secondary | ICD-10-CM | POA: Insufficient documentation

## 2013-10-07 DIAGNOSIS — J019 Acute sinusitis, unspecified: Secondary | ICD-10-CM | POA: Insufficient documentation

## 2013-10-07 DIAGNOSIS — K5732 Diverticulitis of large intestine without perforation or abscess without bleeding: Secondary | ICD-10-CM | POA: Insufficient documentation

## 2013-10-07 DIAGNOSIS — M719 Bursopathy, unspecified: Secondary | ICD-10-CM | POA: Insufficient documentation

## 2013-10-07 DIAGNOSIS — N200 Calculus of kidney: Secondary | ICD-10-CM | POA: Insufficient documentation

## 2013-10-07 DIAGNOSIS — L720 Epidermal cyst: Secondary | ICD-10-CM | POA: Insufficient documentation

## 2013-10-07 DIAGNOSIS — J309 Allergic rhinitis, unspecified: Secondary | ICD-10-CM | POA: Insufficient documentation

## 2013-10-07 DIAGNOSIS — M25569 Pain in unspecified knee: Secondary | ICD-10-CM | POA: Insufficient documentation

## 2013-10-10 ENCOUNTER — Ambulatory Visit: Payer: Medicare Other | Admitting: Rehabilitation

## 2013-10-10 DIAGNOSIS — IMO0001 Reserved for inherently not codable concepts without codable children: Secondary | ICD-10-CM | POA: Diagnosis not present

## 2013-10-13 ENCOUNTER — Ambulatory Visit: Payer: Medicare Other | Admitting: Rehabilitation

## 2013-10-13 DIAGNOSIS — IMO0001 Reserved for inherently not codable concepts without codable children: Secondary | ICD-10-CM | POA: Diagnosis not present

## 2013-10-17 ENCOUNTER — Ambulatory Visit: Payer: Medicare Other | Admitting: Rehabilitation

## 2013-10-17 DIAGNOSIS — IMO0001 Reserved for inherently not codable concepts without codable children: Secondary | ICD-10-CM | POA: Diagnosis not present

## 2013-10-20 ENCOUNTER — Ambulatory Visit: Payer: Medicare Other | Admitting: Rehabilitation

## 2013-10-24 ENCOUNTER — Ambulatory Visit: Payer: Medicare Other | Admitting: Rehabilitation

## 2013-10-25 ENCOUNTER — Ambulatory Visit: Payer: Medicare Other | Admitting: Rehabilitation

## 2014-01-03 DIAGNOSIS — M8580 Other specified disorders of bone density and structure, unspecified site: Secondary | ICD-10-CM | POA: Insufficient documentation

## 2014-08-08 DIAGNOSIS — N201 Calculus of ureter: Secondary | ICD-10-CM | POA: Insufficient documentation

## 2014-10-20 DIAGNOSIS — M545 Low back pain, unspecified: Secondary | ICD-10-CM | POA: Insufficient documentation

## 2015-01-09 ENCOUNTER — Other Ambulatory Visit: Payer: Self-pay | Admitting: Neurological Surgery

## 2015-01-09 DIAGNOSIS — M4316 Spondylolisthesis, lumbar region: Secondary | ICD-10-CM

## 2015-01-16 ENCOUNTER — Other Ambulatory Visit: Payer: Self-pay | Admitting: Neurological Surgery

## 2015-01-16 DIAGNOSIS — R5381 Other malaise: Secondary | ICD-10-CM

## 2015-01-18 ENCOUNTER — Ambulatory Visit
Admission: RE | Admit: 2015-01-18 | Discharge: 2015-01-18 | Disposition: A | Discharge status: Home / Self Care | Payer: Medicare Other | Source: Ambulatory Visit | Attending: Neurological Surgery | Admitting: Neurological Surgery

## 2015-01-18 DIAGNOSIS — M4316 Spondylolisthesis, lumbar region: Secondary | ICD-10-CM

## 2015-01-18 MED ORDER — ONDANSETRON HCL 4 MG/2ML IJ SOLN
4.0000 mg | Freq: Once | INTRAMUSCULAR | Status: AC
  Administered 2015-01-18: 4 mg via INTRAMUSCULAR

## 2015-01-18 MED ORDER — MEPERIDINE HCL 100 MG/ML IJ SOLN
75.0000 mg | Freq: Once | INTRAMUSCULAR | Status: AC
  Administered 2015-01-18: 75 mg via INTRAMUSCULAR

## 2015-01-18 MED ORDER — IOHEXOL 180 MG/ML  SOLN
15.0000 mL | Freq: Once | INTRAMUSCULAR | Status: DC | PRN
  Administered 2015-01-18: 15 mL via INTRATHECAL

## 2015-01-18 MED ORDER — DIAZEPAM 5 MG PO TABS
5.0000 mg | ORAL_TABLET | Freq: Once | ORAL | Status: AC
  Administered 2015-01-18: 5 mg via ORAL

## 2015-01-18 NOTE — Discharge Instructions (Signed)

## 2015-01-29 ENCOUNTER — Other Ambulatory Visit (HOSPITAL_COMMUNITY): Payer: Self-pay | Admitting: Neurological Surgery

## 2015-02-14 ENCOUNTER — Ambulatory Visit (HOSPITAL_COMMUNITY)
Admission: RE | Admit: 2015-02-14 | Discharge: 2015-02-14 | Disposition: A | Discharge status: Home / Self Care | Payer: Medicare Other | Source: Ambulatory Visit | Attending: Neurological Surgery | Admitting: Neurological Surgery

## 2015-02-14 ENCOUNTER — Encounter (HOSPITAL_COMMUNITY): Payer: Self-pay

## 2015-02-14 ENCOUNTER — Encounter (HOSPITAL_COMMUNITY)
Admission: RE | Admit: 2015-02-14 | Discharge: 2015-02-14 | Disposition: A | Discharge status: Home / Self Care | Payer: Medicare Other | Source: Ambulatory Visit | Attending: Neurological Surgery | Admitting: Neurological Surgery

## 2015-02-14 DIAGNOSIS — Z79899 Other long term (current) drug therapy: Secondary | ICD-10-CM | POA: Diagnosis not present

## 2015-02-14 DIAGNOSIS — Z01812 Encounter for preprocedural laboratory examination: Secondary | ICD-10-CM | POA: Insufficient documentation

## 2015-02-14 DIAGNOSIS — Z0183 Encounter for blood typing: Secondary | ICD-10-CM | POA: Insufficient documentation

## 2015-02-14 DIAGNOSIS — Z87891 Personal history of nicotine dependence: Secondary | ICD-10-CM | POA: Diagnosis not present

## 2015-02-14 DIAGNOSIS — Z01818 Encounter for other preprocedural examination: Secondary | ICD-10-CM | POA: Insufficient documentation

## 2015-02-14 DIAGNOSIS — M4806 Spinal stenosis, lumbar region: Secondary | ICD-10-CM | POA: Insufficient documentation

## 2015-02-14 DIAGNOSIS — M48061 Spinal stenosis, lumbar region without neurogenic claudication: Secondary | ICD-10-CM

## 2015-02-14 DIAGNOSIS — I10 Essential (primary) hypertension: Secondary | ICD-10-CM | POA: Insufficient documentation

## 2015-02-14 HISTORY — DX: Anemia, unspecified: D64.9

## 2015-02-14 HISTORY — DX: Pneumonia, unspecified organism: J18.9

## 2015-02-14 HISTORY — DX: Cardiac arrhythmia, unspecified: I49.9

## 2015-02-14 HISTORY — DX: Personal history of urinary calculi: Z87.442

## 2015-02-14 HISTORY — DX: Headache, unspecified: R51.9

## 2015-02-14 HISTORY — DX: Adverse effect of unspecified anesthetic, initial encounter: T41.45XA

## 2015-02-14 HISTORY — DX: Nausea with vomiting, unspecified: R11.2

## 2015-02-14 HISTORY — DX: Other complications of anesthesia, initial encounter: T88.59XA

## 2015-02-14 HISTORY — DX: Family history of other specified conditions: Z84.89

## 2015-02-14 HISTORY — DX: Nausea with vomiting, unspecified: Z98.890

## 2015-02-14 HISTORY — DX: Headache: R51

## 2015-02-14 HISTORY — DX: Unspecified osteoarthritis, unspecified site: M19.90

## 2015-02-14 LAB — CBC WITH DIFFERENTIAL/PLATELET
BASOS ABS: 0.1 10*3/uL (ref 0.0–0.1)
Basophils Relative: 1 %
EOS ABS: 0.3 10*3/uL (ref 0.0–0.7)
EOS PCT: 4 %
HCT: 43 % (ref 36.0–46.0)
Hemoglobin: 14.6 g/dL (ref 12.0–15.0)
LYMPHS PCT: 33 %
Lymphs Abs: 2.4 10*3/uL (ref 0.7–4.0)
MCH: 31.7 pg (ref 26.0–34.0)
MCHC: 34 g/dL (ref 30.0–36.0)
MCV: 93.5 fL (ref 78.0–100.0)
MONO ABS: 0.6 10*3/uL (ref 0.1–1.0)
Monocytes Relative: 8 %
Neutro Abs: 3.9 10*3/uL (ref 1.7–7.7)
Neutrophils Relative %: 54 %
PLATELETS: 190 10*3/uL (ref 150–400)
RBC: 4.6 MIL/uL (ref 3.87–5.11)
RDW: 12.5 % (ref 11.5–15.5)
WBC: 7.2 10*3/uL (ref 4.0–10.5)

## 2015-02-14 LAB — ABO/RH: ABO/RH(D): O POS

## 2015-02-14 LAB — BASIC METABOLIC PANEL
Anion gap: 13 (ref 5–15)
BUN: 15 mg/dL (ref 6–20)
CO2: 24 mmol/L (ref 22–32)
CREATININE: 0.76 mg/dL (ref 0.44–1.00)
Calcium: 9.5 mg/dL (ref 8.9–10.3)
Chloride: 103 mmol/L (ref 101–111)
GFR calc Af Amer: >60 mL/min (ref >60)
GLUCOSE: 94 mg/dL (ref 65–99)
POTASSIUM: 3.5 mmol/L (ref 3.5–5.1)
SODIUM: 140 mmol/L (ref 135–145)

## 2015-02-14 LAB — SURGICAL PCR SCREEN
MRSA, PCR: NEGATIVE
Staphylococcus aureus: NEGATIVE

## 2015-02-14 LAB — TYPE AND SCREEN
ABO/RH(D): O POS
Antibody Screen: NEGATIVE

## 2015-02-14 LAB — PROTIME-INR
INR: 0.9 (ref 0.00–1.49)
PROTHROMBIN TIME: 12.4 s (ref 11.6–15.2)

## 2015-02-14 NOTE — Pre-Procedure Instructions (Addendum)
Connie Barnes  02/14/2015      [id number] - [place], [place] - [address] AT [address]CROSS [address] [place] [address] Phone: [phone] Fax: [phone]  [place], [place] [address] Phone: [phone] Fax: [phone]    Your procedure is scheduled on 02/22/15.  Report to [facility] short stay admitting at 800 A.M.  Call this number if you have problems the morning of surgery:  [phone]   Remember:  Do not eat food or drink liquids after midnight.  Take these medicines the morning of surgery with A SIP OF WATER acyclovir, estradiol, eye drops if needed, methocarbamol if needed  STOP all herbel meds, nsaids (aleve,naproxen,advil,ibuprofen) 5 days prior to surgery starting 02/17/15 including aspirin, vit D,multi vit nabumatone    Do not wear jewelry, make-up or nail polish.  Do not wear lotions, powders, or perfumes.  You may wear deodorant.  Do not shave 48 hours prior to surgery.  Men may shave face and neck.  Do not bring valuables to the hospital.  [facility] is not responsible for any belongings or valuables.  Contacts, dentures or bridgework may not be worn into surgery.  Leave your suitcase in the car.  After surgery it may be brought to your room.  For patients admitted to the hospital, discharge time will be determined by your treatment team.  Patients discharged the day of surgery will not be allowed to drive home.   Name and phone number of your driver:    Special instructions:   Special Instructions: [place] - Preparing for Surgery  Before surgery, you can play an important role.  Because skin is not sterile, your skin needs to be as free of germs as possible.  You can reduce the number of germs on you skin by washing with CHG (chlorahexidine gluconate) soap before surgery.  CHG is an antiseptic  cleaner which kills germs and bonds with the skin to continue killing germs even after washing.  Please DO NOT use if you have an allergy to CHG or antibacterial soaps.  If your skin becomes reddened/irritated stop using the CHG and inform your nurse when you arrive at Short Stay.  Do not shave (including legs and underarms) for at least 48 hours prior to the first CHG shower.  You may shave your face.  Please follow these instructions carefully:   1.  Shower with CHG Soap the night before surgery and the morning of Surgery.  2.  If you choose to wash your hair, wash your hair first as usual with your normal shampoo.  3.  After you shampoo, rinse your hair and body thoroughly to remove the Shampoo.  4.  Use CHG as you would any other liquid soap.  You can apply chg directly  to the skin and wash gently with scrungie or a clean washcloth.  5.  Apply the CHG Soap to your body ONLY FROM THE NECK DOWN.  Do not use on open wounds or open sores.  Avoid contact with your eyes ears, mouth and genitals (private parts).  Wash genitals (private parts)       with your normal soap.  6.  Wash thoroughly, paying special attention to the area where your surgery will be performed.  7.  Thoroughly rinse your body with warm water from the neck down.  8.  DO NOT shower/wash with your normal soap after using and rinsing off the CHG Soap.  9.  Pat yourself dry with a clean towel.            10.  Wear clean pajamas.            11.  Place clean sheets on your bed the night of your first shower and do not sleep with pets.  Day of Surgery  Do not apply any lotions/deodorants the morning of surgery.  Please wear clean clothes to the hospital/surgery center.  Please read over the following fact sheets that you were given. Pain Booklet, Coughing and Deep Breathing, Blood Transfusion Information, MRSA Information and Surgical Site Infection Prevention

## 2015-02-15 NOTE — Progress Notes (Signed)
Anesthesia Chart Review:  Pt is 69 year old female scheduled for R L1-2, L2-3 XLIF, cortical pedicle screws L1-3 on 02/22/2015 with Dr. Ronnald Ramp.   PMH includes: atrial fibrillation (x 1 in 2010, after lithotripsy), HTN. Former smoker. BMI 26.   Anesthesia history includes post-op N/V, takes a "long time to get over anesthesia", belligerent after anesthesia.   Medications include: ASA, losartan, simvastatin, dyazide  Preoperative labs reviewed.    Chest x-ray 02/14/2015 reviewed. No active cardiopulmonary disease.   EKG 02/14/2015: NSR with sinus arrhythmia. Possible Inferior infarct, age undetermined. Cannot rule out Anterior infarct, age undetermined. Appears stable when compared to tracing dated 12/04/2010.  Echo 10/31/2009 ([facility]): - Normal LV systolic function. EF 63%.  - Mild LAE - Mild mitral regurgitation - Mild tricuspid regurgitation with normal pulmonary pressure - Bulging intraatrial septum with no evidence of shunt by doppler  If no changes, I anticipate pt can proceed with surgery as scheduled.   Willeen Cass, FNP-BC [facility] Short Stay Surgical Center/Anesthesiology Phone: [phone] 02/15/2015 12:57 PM

## 2015-02-21 MED ORDER — VANCOMYCIN HCL IN DEXTROSE 1-5 GM/200ML-% IV SOLN
1000.0000 mg | INTRAVENOUS | Status: AC
  Administered 2015-02-22: 1000 mg via INTRAVENOUS
  Filled 2015-02-21: qty 200

## 2015-02-21 MED ORDER — DEXAMETHASONE SODIUM PHOSPHATE 10 MG/ML IJ SOLN
10.0000 mg | INTRAMUSCULAR | Status: AC
Start: 2015-02-22 — End: 2015-02-22
  Administered 2015-02-22: 10 mg via INTRAVENOUS
  Filled 2015-02-21: qty 1

## 2015-02-22 ENCOUNTER — Inpatient Hospital Stay (HOSPITAL_COMMUNITY): Payer: Medicare Other

## 2015-02-22 ENCOUNTER — Encounter (HOSPITAL_COMMUNITY)
Admission: RE | Disposition: A | Discharge status: Home Health | Payer: Self-pay | Source: Ambulatory Visit | Attending: Neurological Surgery

## 2015-02-22 ENCOUNTER — Encounter (HOSPITAL_COMMUNITY): Payer: Self-pay | Admitting: *Deleted

## 2015-02-22 ENCOUNTER — Inpatient Hospital Stay (HOSPITAL_COMMUNITY)
Admission: RE | Admit: 2015-02-22 | Discharge: 2015-02-25 | DRG: 455 | Disposition: A | Discharge status: Home Health | Payer: Medicare Other | Source: Ambulatory Visit | Attending: Neurological Surgery | Admitting: Neurological Surgery

## 2015-02-22 ENCOUNTER — Inpatient Hospital Stay (HOSPITAL_COMMUNITY): Payer: Medicare Other | Admitting: Anesthesiology

## 2015-02-22 ENCOUNTER — Inpatient Hospital Stay (HOSPITAL_COMMUNITY): Payer: Medicare Other | Admitting: Emergency Medicine

## 2015-02-22 DIAGNOSIS — Z96641 Presence of right artificial hip joint: Secondary | ICD-10-CM | POA: Diagnosis present

## 2015-02-22 DIAGNOSIS — Z79899 Other long term (current) drug therapy: Secondary | ICD-10-CM

## 2015-02-22 DIAGNOSIS — Z7982 Long term (current) use of aspirin: Secondary | ICD-10-CM

## 2015-02-22 DIAGNOSIS — Z885 Allergy status to narcotic agent status: Secondary | ICD-10-CM

## 2015-02-22 DIAGNOSIS — M79604 Pain in right leg: Secondary | ICD-10-CM | POA: Diagnosis present

## 2015-02-22 DIAGNOSIS — Z888 Allergy status to other drugs, medicaments and biological substances status: Secondary | ICD-10-CM

## 2015-02-22 DIAGNOSIS — Z90710 Acquired absence of both cervix and uterus: Secondary | ICD-10-CM | POA: Diagnosis not present

## 2015-02-22 DIAGNOSIS — M4806 Spinal stenosis, lumbar region: Principal | ICD-10-CM | POA: Diagnosis present

## 2015-02-22 DIAGNOSIS — M419 Scoliosis, unspecified: Secondary | ICD-10-CM | POA: Diagnosis present

## 2015-02-22 DIAGNOSIS — M549 Dorsalgia, unspecified: Secondary | ICD-10-CM | POA: Diagnosis present

## 2015-02-22 DIAGNOSIS — Z880 Allergy status to penicillin: Secondary | ICD-10-CM | POA: Diagnosis not present

## 2015-02-22 DIAGNOSIS — Z981 Arthrodesis status: Secondary | ICD-10-CM

## 2015-02-22 DIAGNOSIS — I10 Essential (primary) hypertension: Secondary | ICD-10-CM | POA: Diagnosis present

## 2015-02-22 DIAGNOSIS — Z87891 Personal history of nicotine dependence: Secondary | ICD-10-CM | POA: Diagnosis not present

## 2015-02-22 DIAGNOSIS — Z881 Allergy status to other antibiotic agents status: Secondary | ICD-10-CM

## 2015-02-22 DIAGNOSIS — M5136 Other intervertebral disc degeneration, lumbar region: Secondary | ICD-10-CM | POA: Diagnosis not present

## 2015-02-22 DIAGNOSIS — Z419 Encounter for procedure for purposes other than remedying health state, unspecified: Secondary | ICD-10-CM

## 2015-02-22 HISTORY — PX: LAMINECTOMY WITH POSTERIOR LATERAL ARTHRODESIS LEVEL 2: SHX6336

## 2015-02-22 HISTORY — PX: ANTERIOR LAT LUMBAR FUSION: SHX1168

## 2015-02-22 SURGERY — ANTERIOR LATERAL LUMBAR FUSION 2 LEVELS
Anesthesia: General | Site: Flank | Laterality: Right

## 2015-02-22 MED ORDER — PHENOL 1.4 % MT LIQD: 1.0000 | OROMUCOSAL | Status: DC | PRN

## 2015-02-22 MED ORDER — ACETAMINOPHEN 325 MG PO TABS: 650.0000 mg | ORAL_TABLET | ORAL | Status: DC | PRN

## 2015-02-22 MED ORDER — ROCURONIUM BROMIDE 50 MG/5ML IV SOLN
INTRAVENOUS | Status: AC
  Filled 2015-02-22: qty 1

## 2015-02-22 MED ORDER — 0.9 % SODIUM CHLORIDE (POUR BTL) OPTIME
TOPICAL | Status: DC | PRN
  Administered 2015-02-22: 1000 mL

## 2015-02-22 MED ORDER — OXYCODONE-ACETAMINOPHEN 5-325 MG PO TABS
1.0000 | ORAL_TABLET | ORAL | Status: DC | PRN
  Administered 2015-02-23 – 2015-02-24 (×4): 2 via ORAL
  Administered 2015-02-24: 1 via ORAL
  Administered 2015-02-24: 2 via ORAL
  Administered 2015-02-25 (×2): 1 via ORAL
  Filled 2015-02-22 (×3): qty 2
  Filled 2015-02-22 (×2): qty 1
  Filled 2015-02-22 (×2): qty 2
  Filled 2015-02-22: qty 1

## 2015-02-22 MED ORDER — FENTANYL CITRATE (PF) 250 MCG/5ML IJ SOLN
INTRAMUSCULAR | Status: AC
  Filled 2015-02-22: qty 5

## 2015-02-22 MED ORDER — VANCOMYCIN HCL IN DEXTROSE 750-5 MG/150ML-% IV SOLN
750.0000 mg | Freq: Two times a day (BID) | INTRAVENOUS | Status: DC
Start: 2015-02-22 — End: 2015-02-25
  Administered 2015-02-22 – 2015-02-25 (×6): 750 mg via INTRAVENOUS
  Filled 2015-02-22 (×7): qty 150

## 2015-02-22 MED ORDER — PROPOFOL 10 MG/ML IV BOLUS
INTRAVENOUS | Status: AC
  Filled 2015-02-22: qty 20

## 2015-02-22 MED ORDER — HYDROMORPHONE HCL 1 MG/ML IJ SOLN
0.2500 mg | INTRAMUSCULAR | Status: DC | PRN
  Administered 2015-02-22 (×2): 0.5 mg via INTRAVENOUS

## 2015-02-22 MED ORDER — ARTIFICIAL TEARS OP OINT
TOPICAL_OINTMENT | OPHTHALMIC | Status: AC
  Filled 2015-02-22: qty 3.5

## 2015-02-22 MED ORDER — ESTRADIOL 1 MG PO TABS
0.5000 mg | ORAL_TABLET | Freq: Every day | ORAL | Status: DC
  Administered 2015-02-23 – 2015-02-25 (×3): 0.5 mg via ORAL
  Filled 2015-02-22 (×3): qty 0.5

## 2015-02-22 MED ORDER — SODIUM CHLORIDE 0.9 % IJ SOLN: 3.0000 mL | INTRAMUSCULAR | Status: DC | PRN

## 2015-02-22 MED ORDER — SODIUM CHLORIDE 0.9 % IV SOLN: 250.0000 mL | INTRAVENOUS | Status: DC

## 2015-02-22 MED ORDER — ASPIRIN EC 81 MG PO TBEC
81.0000 mg | DELAYED_RELEASE_TABLET | Freq: Every day | ORAL | Status: DC
  Administered 2015-02-22 – 2015-02-25 (×4): 81 mg via ORAL
  Filled 2015-02-22 (×6): qty 1

## 2015-02-22 MED ORDER — VANCOMYCIN HCL 1000 MG IV SOLR
INTRAVENOUS | Status: AC
  Filled 2015-02-22: qty 1000

## 2015-02-22 MED ORDER — ONDANSETRON HCL 4 MG/2ML IJ SOLN
INTRAMUSCULAR | Status: AC
  Filled 2015-02-22: qty 2

## 2015-02-22 MED ORDER — ALBUMIN HUMAN 5 % IV SOLN
INTRAVENOUS | Status: DC | PRN
  Administered 2015-02-22: 13:00:00 via INTRAVENOUS

## 2015-02-22 MED ORDER — CELECOXIB 200 MG PO CAPS
200.0000 mg | ORAL_CAPSULE | Freq: Two times a day (BID) | ORAL | Status: DC
  Administered 2015-02-22 – 2015-02-25 (×5): 200 mg via ORAL
  Filled 2015-02-22 (×5): qty 1

## 2015-02-22 MED ORDER — SODIUM CHLORIDE 0.9 % IR SOLN
Status: DC | PRN
  Administered 2015-02-22: 11:00:00

## 2015-02-22 MED ORDER — ONDANSETRON HCL 4 MG/2ML IJ SOLN
4.0000 mg | INTRAMUSCULAR | Status: DC | PRN
  Administered 2015-02-22 – 2015-02-23 (×2): 4 mg via INTRAVENOUS
  Filled 2015-02-22 (×2): qty 2

## 2015-02-22 MED ORDER — ARTIFICIAL TEARS OP OINT
TOPICAL_OINTMENT | OPHTHALMIC | Status: DC | PRN
  Administered 2015-02-22: 1 via OPHTHALMIC

## 2015-02-22 MED ORDER — PROPOFOL 10 MG/ML IV BOLUS
INTRAVENOUS | Status: DC | PRN
  Administered 2015-02-22: 140 mg via INTRAVENOUS

## 2015-02-22 MED ORDER — LOSARTAN POTASSIUM 50 MG PO TABS
25.0000 mg | ORAL_TABLET | Freq: Every day | ORAL | Status: DC
  Administered 2015-02-22 – 2015-02-23 (×2): 25 mg via ORAL
  Filled 2015-02-22 (×4): qty 1

## 2015-02-22 MED ORDER — THROMBIN 20000 UNITS EX SOLR
CUTANEOUS | Status: DC | PRN
  Administered 2015-02-22: 11:00:00 via TOPICAL

## 2015-02-22 MED ORDER — ACETAMINOPHEN 650 MG RE SUPP: 650.0000 mg | RECTAL | Status: DC | PRN

## 2015-02-22 MED ORDER — MIDAZOLAM HCL 2 MG/2ML IJ SOLN
INTRAMUSCULAR | Status: AC
  Filled 2015-02-22: qty 4

## 2015-02-22 MED ORDER — HYDROMORPHONE HCL 1 MG/ML IJ SOLN
INTRAMUSCULAR | Status: AC
  Filled 2015-02-22: qty 1

## 2015-02-22 MED ORDER — GABAPENTIN 100 MG PO CAPS
100.0000 mg | ORAL_CAPSULE | Freq: Every day | ORAL | Status: DC
  Administered 2015-02-22 – 2015-02-24 (×3): 100 mg via ORAL
  Filled 2015-02-22 (×3): qty 1

## 2015-02-22 MED ORDER — LIDOCAINE HCL (CARDIAC) 20 MG/ML IV SOLN
INTRAVENOUS | Status: DC | PRN
  Administered 2015-02-22: 100 mg via INTRAVENOUS

## 2015-02-22 MED ORDER — LACTATED RINGERS IV SOLN
INTRAVENOUS | Status: DC | PRN
  Administered 2015-02-22 (×3): via INTRAVENOUS

## 2015-02-22 MED ORDER — TIZANIDINE HCL 4 MG PO TABS
4.0000 mg | ORAL_TABLET | Freq: Three times a day (TID) | ORAL | Status: DC | PRN
Start: 2015-02-22 — End: 2015-02-25
  Filled 2015-02-22: qty 1

## 2015-02-22 MED ORDER — LACTATED RINGERS IV SOLN
INTRAVENOUS | Status: DC
  Administered 2015-02-22: 09:00:00 via INTRAVENOUS

## 2015-02-22 MED ORDER — SENNA 8.6 MG PO TABS
1.0000 | ORAL_TABLET | Freq: Two times a day (BID) | ORAL | Status: DC
  Administered 2015-02-22 – 2015-02-25 (×6): 8.6 mg via ORAL
  Filled 2015-02-22 (×6): qty 1

## 2015-02-22 MED ORDER — VANCOMYCIN HCL 1000 MG IV SOLR
INTRAVENOUS | Status: DC | PRN
  Administered 2015-02-22: 1000 mg via TOPICAL

## 2015-02-22 MED ORDER — SUCCINYLCHOLINE CHLORIDE 20 MG/ML IJ SOLN
INTRAMUSCULAR | Status: DC | PRN
  Administered 2015-02-22: 80 mg via INTRAVENOUS

## 2015-02-22 MED ORDER — ACETAMINOPHEN 10 MG/ML IV SOLN
INTRAVENOUS | Status: AC
  Filled 2015-02-22: qty 100

## 2015-02-22 MED ORDER — HYDROMORPHONE HCL 1 MG/ML IJ SOLN
0.5000 mg | INTRAMUSCULAR | Status: DC | PRN
  Administered 2015-02-22 – 2015-02-23 (×2): 1 mg via INTRAVENOUS
  Filled 2015-02-22 (×3): qty 1

## 2015-02-22 MED ORDER — POTASSIUM CHLORIDE IN NACL 20-0.9 MEQ/L-% IV SOLN
INTRAVENOUS | Status: DC
  Administered 2015-02-22 – 2015-02-24 (×3): via INTRAVENOUS
  Filled 2015-02-22 (×5): qty 1000

## 2015-02-22 MED ORDER — FENTANYL CITRATE (PF) 100 MCG/2ML IJ SOLN
INTRAMUSCULAR | Status: DC | PRN
  Administered 2015-02-22 (×7): 50 ug via INTRAVENOUS
  Administered 2015-02-22: 100 ug via INTRAVENOUS
  Administered 2015-02-22: 50 ug via INTRAVENOUS

## 2015-02-22 MED ORDER — PROMETHAZINE HCL 25 MG/ML IJ SOLN: 6.2500 mg | INTRAMUSCULAR | Status: DC | PRN

## 2015-02-22 MED ORDER — MEPERIDINE HCL 25 MG/ML IJ SOLN: 6.2500 mg | INTRAMUSCULAR | Status: DC | PRN

## 2015-02-22 MED ORDER — PROPOFOL 500 MG/50ML IV EMUL
INTRAVENOUS | Status: DC | PRN
  Administered 2015-02-22: 25 ug/kg/min via INTRAVENOUS

## 2015-02-22 MED ORDER — ONDANSETRON HCL 4 MG/2ML IJ SOLN
INTRAMUSCULAR | Status: DC | PRN
  Administered 2015-02-22: 4 mg via INTRAVENOUS

## 2015-02-22 MED ORDER — SUCCINYLCHOLINE CHLORIDE 20 MG/ML IJ SOLN
INTRAMUSCULAR | Status: AC
  Filled 2015-02-22: qty 1

## 2015-02-22 MED ORDER — SODIUM CHLORIDE 0.9 % IJ SOLN
3.0000 mL | Freq: Two times a day (BID) | INTRAMUSCULAR | Status: DC
  Administered 2015-02-23: 3 mL via INTRAVENOUS

## 2015-02-22 MED ORDER — ACETAMINOPHEN 10 MG/ML IV SOLN
INTRAVENOUS | Status: DC | PRN
  Administered 2015-02-22: 1000 mg via INTRAVENOUS

## 2015-02-22 MED ORDER — MENTHOL 3 MG MT LOZG: 1.0000 | LOZENGE | OROMUCOSAL | Status: DC | PRN

## 2015-02-22 MED ORDER — ACYCLOVIR 800 MG PO TABS
800.0000 mg | ORAL_TABLET | Freq: Every day | ORAL | Status: DC
  Administered 2015-02-23 – 2015-02-25 (×3): 800 mg via ORAL
  Filled 2015-02-22 (×3): qty 1

## 2015-02-22 MED ORDER — MIDAZOLAM HCL 5 MG/5ML IJ SOLN
INTRAMUSCULAR | Status: DC | PRN
  Administered 2015-02-22: 2 mg via INTRAVENOUS

## 2015-02-22 MED ORDER — TRIAMTERENE-HCTZ 37.5-25 MG PO TABS
1.0000 | ORAL_TABLET | ORAL | Status: DC
  Administered 2015-02-22 – 2015-02-23 (×2): 1 via ORAL
  Filled 2015-02-22 (×2): qty 1
  Filled 2015-02-22: qty 2
  Filled 2015-02-22 (×4): qty 1

## 2015-02-22 MED ORDER — BUPIVACAINE HCL (PF) 0.25 % IJ SOLN
INTRAMUSCULAR | Status: DC | PRN
  Administered 2015-02-22: 8 mL
  Administered 2015-02-22: 6 mL

## 2015-02-22 MED ORDER — THROMBIN 5000 UNITS EX SOLR
OROMUCOSAL | Status: DC | PRN
  Administered 2015-02-22: 11:00:00 via TOPICAL

## 2015-02-22 MED ORDER — DEXTROSE 5 % IV SOLN
INTRAVENOUS | Status: DC | PRN
  Administered 2015-02-22: 09:00:00 via INTRAVENOUS

## 2015-02-22 MED ORDER — SODIUM CHLORIDE 0.9 % IV SOLN
10.0000 mg | INTRAVENOUS | Status: DC | PRN
  Administered 2015-02-22: 10 ug/min via INTRAVENOUS

## 2015-02-22 SURGICAL SUPPLY — 69 items
BAG DECANTER FOR FLEXI CONT (MISCELLANEOUS) ×3 IMPLANT
BENZOIN TINCTURE PRP APPL 2/3 (GAUZE/BANDAGES/DRESSINGS) ×3 IMPLANT
BIT DRILL PLIF MAS 5.0MM DISP (DRILL) ×2 IMPLANT
BLADE CLIPPER SURG (BLADE) IMPLANT
BONE CANC CHIPS 40CC CAN1/2 (Bone Implant) ×3 IMPLANT
BONE MATRIX OSTEOCEL PRO LRG (Bone Implant) ×3 IMPLANT
CHIPS CANC BONE 40CC CAN1/2 (Bone Implant) ×2 IMPLANT
CLIP NEUROVISION LG (CLIP) ×3 IMPLANT
COROENT XL-W 10X22X50 (Orthopedic Implant) ×3 IMPLANT
DERMABOND ADHESIVE PROPEN (GAUZE/BANDAGES/DRESSINGS) ×3
DERMABOND ADVANCED (GAUZE/BANDAGES/DRESSINGS)
DERMABOND ADVANCED .7 DNX12 (GAUZE/BANDAGES/DRESSINGS) IMPLANT
DERMABOND ADVANCED .7 DNX6 (GAUZE/BANDAGES/DRESSINGS) ×6 IMPLANT
DRAPE C-ARM 42X72 X-RAY (DRAPES) ×12 IMPLANT
DRAPE C-ARMOR (DRAPES) ×6 IMPLANT
DRAPE LAPAROTOMY 100X72X124 (DRAPES) ×6 IMPLANT
DRAPE POUCH INSTRU U-SHP 10X18 (DRAPES) ×6 IMPLANT
DRAPE SURG 17X23 STRL (DRAPES) ×15 IMPLANT
DRILL PLIF MAS 5.0MM DISP (DRILL) ×3
DRSG OPSITE 4X5.5 SM (GAUZE/BANDAGES/DRESSINGS) ×3 IMPLANT
DRSG OPSITE POSTOP 4X6 (GAUZE/BANDAGES/DRESSINGS) ×3 IMPLANT
DURAPREP 26ML APPLICATOR (WOUND CARE) ×6 IMPLANT
ELECT REM PT RETURN 9FT ADLT (ELECTROSURGICAL) ×6
ELECTRODE REM PT RTRN 9FT ADLT (ELECTROSURGICAL) ×4 IMPLANT
EVACUATOR 1/8 PVC DRAIN (DRAIN) ×3 IMPLANT
GAUZE SPONGE 4X4 16PLY XRAY LF (GAUZE/BANDAGES/DRESSINGS) IMPLANT
GLOVE BIO SURGEON STRL SZ8 (GLOVE) ×9 IMPLANT
GLOVE BIOGEL PI IND STRL 6.5 (GLOVE) ×2 IMPLANT
GLOVE BIOGEL PI IND STRL 8.5 (GLOVE) ×2 IMPLANT
GLOVE BIOGEL PI INDICATOR 6.5 (GLOVE) ×1
GLOVE BIOGEL PI INDICATOR 8.5 (GLOVE) ×1
GLOVE ECLIPSE 6.5 STRL STRAW (GLOVE) ×3 IMPLANT
GOWN STRL REUS W/ TWL LRG LVL3 (GOWN DISPOSABLE) ×2 IMPLANT
GOWN STRL REUS W/ TWL XL LVL3 (GOWN DISPOSABLE) ×6 IMPLANT
GOWN STRL REUS W/TWL 2XL LVL3 (GOWN DISPOSABLE) ×6 IMPLANT
GOWN STRL REUS W/TWL LRG LVL3 (GOWN DISPOSABLE) ×1
GOWN STRL REUS W/TWL XL LVL3 (GOWN DISPOSABLE) ×3
HEMOSTAT POWDER KIT SURGIFOAM (HEMOSTASIS) ×3 IMPLANT
IMPLANT COROENT XL 8X45X18MM ×3 IMPLANT
INSYTE AUTOGARD ×3 IMPLANT
KIT BASIN OR (CUSTOM PROCEDURE TRAY) ×6 IMPLANT
KIT DILATOR XLIF 5 (KITS) ×2 IMPLANT
KIT NEEDLE NVM5 EMG ELECT (KITS) ×2 IMPLANT
KIT NEEDLE NVM5 EMG ELECTRODE (KITS) ×1
KIT ROOM TURNOVER OR (KITS) ×3 IMPLANT
KIT SURGICAL ACCESS MAXCESS 4 (KITS) ×3 IMPLANT
KIT XLIF (KITS) ×1
NEEDLE HYPO 25X1 1.5 SAFETY (NEEDLE) ×6 IMPLANT
NS IRRIG 1000ML POUR BTL (IV SOLUTION) ×3 IMPLANT
PACK LAMINECTOMY NEURO (CUSTOM PROCEDURE TRAY) ×6 IMPLANT
ROD ARM15T 90MM (Rod) ×6 IMPLANT
SCREW LOCK (Screw) ×6 IMPLANT
SCREW LOCK FXNS SPNE MAS PL (Screw) ×12 IMPLANT
SCREW SHANK 5.0X35 (Screw) ×12 IMPLANT
SCREW SHANK PLIF 5.0X25 LUMBAR (Screw) ×6 IMPLANT
SCREW TULIP 5.5 (Screw) ×18 IMPLANT
SPONGE LAP 4X18 X RAY DECT (DISPOSABLE) IMPLANT
STRIP CLOSURE SKIN 1/2X4 (GAUZE/BANDAGES/DRESSINGS) ×6 IMPLANT
SUT VIC AB 0 CT1 18XCR BRD8 (SUTURE) ×4 IMPLANT
SUT VIC AB 0 CT1 8-18 (SUTURE) ×2
SUT VIC AB 2-0 CP2 18 (SUTURE) ×6 IMPLANT
SUT VIC AB 3-0 SH 8-18 (SUTURE) ×12 IMPLANT
SYR CONTROL 10ML LL (SYRINGE) ×3 IMPLANT
TAPE CLOTH 3X10 TAN LF (GAUZE/BANDAGES/DRESSINGS) ×6 IMPLANT
TOWEL OR 17X24 6PK STRL BLUE (TOWEL DISPOSABLE) ×3 IMPLANT
TOWEL OR 17X26 10 PK STRL BLUE (TOWEL DISPOSABLE) ×3 IMPLANT
TRAP SPECIMEN MUCOUS 40CC (MISCELLANEOUS) IMPLANT
TRAY FOLEY W/METER SILVER 14FR (SET/KITS/TRAYS/PACK) ×3 IMPLANT
WATER STERILE IRR 1000ML POUR (IV SOLUTION) ×3 IMPLANT

## 2015-02-22 NOTE — H&P (Signed)
Subjective: Patient is a 69 y.o. female admitted for back and RLE pain. Onset of symptoms was several years ago, gradually worsening since that time.  The pain is rated severe, and is located at the across the lower back and radiates to RLE. The pain is described as aching and occurs all day. The symptoms have been progressive. Symptoms are exacerbated by exercise. MRI or CT showed DDD/ stenosis.   Past Medical History  Diagnosis Date  . Hypertension   . Atrial fibrillation ([place])   . Family history of adverse reaction to anesthesia     belligerant sister  . Pneumonia     hx  . History of kidney stones     have numerous stones at present  . Headache   . Arthritis   . Anemia     hx child  . Complication of anesthesia     long time to get over anesthesia, very belligerant,very small veins per pt  . PONV (postoperative nausea and vomiting)   . Dysrhythmia     atrial fib episode after lithotripsy 2010. saw dr Johnanna Schneiders cardiology no visit since    Past Surgical History  Procedure Laterality Date  . Abdominal hysterectomy    . Bladder suspension  12  . Joint replacement  07    rt hip   . Excision morton's neuroma Left     foot  . Dilation and curettage of uterus    . Lipoma excision      back   . Shoulder arthroscopy Right 14  . Tonsillectomy      69 yrs old    Prior to Admission medications   Medication Sig Start Date End Date Taking? Authorizing Provider  acetaminophen (TYLENOL) 500 MG tablet Take 1,000 mg by mouth every 6 (six) hours as needed. pain    Yes Historical Provider, MD  acyclovir (ZOVIRAX) 800 MG tablet Take 800 mg by mouth daily.     Yes Historical Provider, MD  aspirin 81 MG EC tablet Take 81 mg by mouth daily.     Yes Historical Provider, MD  cetirizine (ZYRTEC) 10 MG tablet Take 10 mg by mouth daily.     Yes Historical Provider, MD  Cholecalciferol (VITAMIN D) 1000 UNITS capsule Take 2,000 Units by mouth daily.    Yes Historical Provider, MD   estradiol (ESTRACE) 0.5 MG tablet Take 0.5 mg by mouth daily.    Yes Historical Provider, MD  gabapentin (NEURONTIN) 100 MG capsule TAKE 1 CAPSULE BY MOUTH AT BEDTIME AND INCREASE TO 3 CAPSULES AS DIRECTED 05/25/14  Yes Historical Provider, MD  ibuprofen (ADVIL,MOTRIN) 200 MG tablet Take 800 mg by mouth every 6 (six) hours as needed. pain    Yes Historical Provider, MD  losartan (COZAAR) 25 MG tablet TAKE 1 TABLET BY MOUTH DAILY 11/30/14  Yes Historical Provider, MD  methocarbamol (ROBAXIN) 500 MG tablet Take 500 mg by mouth every 8 (eight) hours as needed for muscle spasms.    Yes Historical Provider, MD  multivitamin-iron-minerals-folic acid (CENTRUM) chewable tablet Chew 1 tablet by mouth daily.    Yes Historical Provider, MD  nabumetone (RELAFEN) 500 MG tablet take 1 tablet by mouth daily as needed for muscle spasms   Yes Historical Provider, MD  Polyethyl Glycol-Propyl Glycol (SYSTANE OP) Place 1 drop into both eyes 2 (two) times daily as needed. Dry eyes    Yes Historical Provider, MD  simvastatin (ZOCOR) 10 MG tablet Take 10 mg by mouth daily at 6 PM.  11/02/14  11/02/15 Yes Historical Provider, MD  triamterene-hydrochlorothiazide (DYAZIDE) 37.5-25 MG per capsule Take 1 capsule by mouth every morning.     Yes Historical Provider, MD  betamethasone dipropionate (DIPROLENE) 0.05 % cream Apply topically as needed.    Historical Provider, MD  clindamycin (CLEOCIN) 300 MG capsule Take 300 mg by mouth See admin instructions. 300mg  prior to dental procedures    Historical Provider, MD  clobetasol cream (TEMOVATE) 5.28 % Apply 1 application topically as needed.    Historical Provider, MD  loratadine (CLARITIN) 10 MG tablet Take 10 mg by mouth daily as needed for allergies.     Historical Provider, MD  Olopatadine HCl (PATADAY) 0.2 % SOLN Place 1 drop into both eyes 2 (two) times daily as needed (dry eyes). Seasonal allergies    Historical Provider, MD  tiZANidine (ZANAFLEX) 4 MG tablet Take 4 mg by mouth  every 8 (eight) hours as needed for muscle spasms.     Historical Provider, MD  triamcinolone cream (KENALOG) 0.1 % Apply 1 application topically daily as needed (irritation).     Historical Provider, MD   Allergies  Allergen Reactions  . Ciprofloxacin Hcl Swelling  . Levofloxacin Other (See Comments)    Inflamed blood vessels  . Morphine And Related Nausea And Vomiting  . Talwin [Pentazocine] Other (See Comments)    hallucinations  . Trazodone And Nefazodone Other (See Comments)    nightmares  . Amoxicillin Other (See Comments)    Yeast infection  . Tetracyclines & Related Other (See Comments)    Yeast infection  . Zanaflex [Tizanidine Hcl] Other (See Comments)    fatigue    Social History  Substance Use Topics  . Smoking status: Former Smoker -- 0.50 packs/day for 7 years    Types: Cigarettes    Quit date: 02/14/2003  . Smokeless tobacco: Not on file  . Alcohol Use: 4.2 oz/week    7 Glasses of wine per week     Comment: gin aand tonic occ , beer occ    History reviewed. No pertinent family history.   Review of Systems  Positive ROS: neg  All other systems have been reviewed and were otherwise negative with the exception of those mentioned in the HPI and as above.  Objective: Vital signs in last 24 hours:    General Appearance: Alert, cooperative, no distress, appears stated age Head: Normocephalic, without obvious abnormality, atraumatic Eyes: PERRL, conjunctiva/corneas clear, EOM's intact    Neck: Supple, symmetrical, trachea midline Back: Symmetric, no curvature, ROM normal, no CVA tenderness Lungs:  respirations unlabored Heart: Regular rate and rhythm Abdomen: Soft, non-tender Extremities: Extremities normal, atraumatic, no cyanosis or edema Pulses: 2+ and symmetric all extremities Skin: Skin color, texture, turgor normal, no rashes or lesions  NEUROLOGIC:   Mental status: Alert and oriented x4,  no aphasia, good attention span, fund of knowledge, and  memory Motor Exam - grossly normal Sensory Exam - grossly normal Reflexes: 1+ Coordination - grossly normal Gait - grossly normal Balance - grossly normal Cranial Nerves: I: smell Not tested  II: visual acuity  OS: nl    OD: nl  II: visual fields Full to confrontation  II: pupils Equal, round, reactive to light  III,VII: ptosis None  III,IV,VI: extraocular muscles  Full ROM  V: mastication Normal  V: facial light touch sensation  Normal  V,VII: corneal reflex  Present  VII: facial muscle function - upper  Normal  VII: facial muscle function - lower Normal  VIII: hearing Not tested  IX: soft palate elevation  Normal  IX,X: gag reflex Present  XI: trapezius strength  5/5  XI: sternocleidomastoid strength 5/5  XI: neck flexion strength  5/5  XII: tongue strength  Normal    Data Review Lab Results  Component Value Date   WBC 7.2 02/14/2015   HGB 14.6 02/14/2015   HCT 43.0 02/14/2015   MCV 93.5 02/14/2015   PLT 190 02/14/2015   Lab Results  Component Value Date   NA 140 02/14/2015   K 3.5 02/14/2015   CL 103 02/14/2015   CO2 24 02/14/2015   BUN 15 02/14/2015   CREATININE 0.76 02/14/2015   GLUCOSE 94 02/14/2015   Lab Results  Component Value Date   INR 0.90 02/14/2015    Assessment/Plan: Patient admitted for XLIF. Patient has failed a reasonable attempt at conservative therapy.  I explained the condition and procedure to the patient and answered any questions.  Patient wishes to proceed with procedure as planned. Understands risks/ benefits and typical outcomes of procedure.   Mysty Kielty S 02/22/2015 9:11 AM

## 2015-02-22 NOTE — Anesthesia Procedure Notes (Signed)
Procedure Name: Intubation Date/Time: 02/22/2015 9:39 AM Performed by: Jacquiline Doe A Pre-anesthesia Checklist: Patient identified, Timeout performed, Emergency Drugs available, Patient being monitored and Suction available Patient Re-evaluated:Patient Re-evaluated prior to inductionOxygen Delivery Method: Circle system utilized Preoxygenation: Pre-oxygenation with 100% oxygen Intubation Type: IV induction and Cricoid Pressure applied Ventilation: Mask ventilation without difficulty and Oral airway inserted - appropriate to patient size Laryngoscope Size: Mac and 4 Grade View: Grade I Tube type: Oral Tube size: 7.5 mm Number of attempts: 1 Airway Equipment and Method: Stylet Placement Confirmation: ETT inserted through vocal cords under direct vision,  breath sounds checked- equal and bilateral and positive ETCO2 Secured at: 22 cm Tube secured with: Tape Dental Injury: Teeth and Oropharynx as per pre-operative assessment

## 2015-02-22 NOTE — Progress Notes (Signed)
Utilization review completed.  

## 2015-02-22 NOTE — Progress Notes (Signed)
ANTIBIOTIC CONSULT NOTE - INITIAL  Pharmacy Consult for vancomycin  Indication: surgical prophylaxis with drain in place  Allergies  Allergen Reactions  . Ciprofloxacin Hcl Swelling  . Levofloxacin Other (See Comments)    Inflamed blood vessels  . Morphine And Related Nausea And Vomiting  . Talwin [Pentazocine] Other (See Comments)    hallucinations  . Trazodone And Nefazodone Other (See Comments)    nightmares  . Amoxicillin Other (See Comments)    Yeast infection  . Tetracyclines & Related Other (See Comments)    Yeast infection  . Zanaflex [Tizanidine Hcl] Other (See Comments)    fatigue    Patient Measurements: Weight 69.9 kg  Height 5'4.5  Vital Signs: Temp: 97.7 F (36.5 C) (11/03 1724) Temp Source: Axillary (11/03 1724) BP: 145/70 mmHg (11/03 1724) Pulse Rate: 81 (11/03 1724) Intake/Output from this shift: Total I/O In: 3150 [I.V.:2900; IV Piggyback:250] Out: 1200 [Urine:700; Drains:100; Blood:400]  Labs: No results for input(s): WBC, HGB, PLT, LABCREA, CREATININE in the last 72 hours. Estimated Creatinine Clearance: 62.6 mL/min (by C-G formula based on Cr of 0.76).  Microbiology: Recent Results (from the past 720 hour(s))  Surgical pcr screen     Status: None   Collection Time: 02/14/15  2:30 PM  Result Value Ref Range Status   MRSA, PCR NEGATIVE NEGATIVE Final   Staphylococcus aureus NEGATIVE NEGATIVE Final    Comment:        The Xpert SA Assay (FDA approved for NASAL specimens in patients over 12 years of age), is one component of a comprehensive surveillance program.  Test performance has been validated by [facility] for patients greater than or equal to 70 year old. It is not intended to diagnose infection nor to guide or monitor treatment.     Medical History: Past Medical History  Diagnosis Date  . Hypertension   . Atrial fibrillation ([place])   . Family history of adverse reaction to anesthesia     belligerant sister  . Pneumonia     hx  . History of kidney stones     have numerous stones at present  . Headache   . Arthritis   . Anemia     hx child  . Complication of anesthesia     long time to get over anesthesia, very belligerant,very small veins per pt  . PONV (postoperative nausea and vomiting)   . Dysrhythmia     atrial fib episode after lithotripsy 2010. saw dr Johnanna Schneiders cardiology no visit since    Medications:  Anti-infectives    Start     Dose/Rate Route Frequency Ordered Stop   02/23/15 1000  acyclovir (ZOVIRAX) tablet 800 mg     800 mg Oral Daily 02/22/15 1748     02/22/15 1404  vancomycin (VANCOCIN) powder  Status:  Discontinued       As needed 02/22/15 1404 02/22/15 1459   02/22/15 1402  vancomycin (VANCOCIN) 1000 MG powder    Comments:  Connie Barnes   : cabinet override      02/22/15 1402 02/23/15 0214   02/22/15 1106  bacitracin 50,000 Units in sodium chloride irrigation 0.9 % 500 mL irrigation  Status:  Discontinued       As needed 02/22/15 1106 02/22/15 1459   02/22/15 0930  vancomycin (VANCOCIN) IVPB 1000 mg/200 mL premix     1,000 mg 200 mL/hr over 60 Minutes Intravenous To ShortStay Surgical 02/21/15 1427 02/22/15 1025     Assessment: 69 year old female status post  back surgery with drain placement to receive vancomycin post-operatively for surgical prophylaxis. Currently patient is afebrile and prior to admission labs were within normal limits. Patient received vancomycin pre-operatively at 0925 AM.   Goal of Therapy:  Vancomycin trough level 10-15 mcg/ml  Plan:  Vancomycin 750 mg IV every 12 hours.  Follow-up drain removal and length of therapy. Monitor renal function, clinical status, and any culture results.   Connie Barnes, PharmD, BCPS Clinical Pharmacist [phone] 02/22/2015,6:00 PM

## 2015-02-22 NOTE — Transfer of Care (Signed)
Immediate Anesthesia Transfer of Care Note  Patient: ANGELIC SCHNELLE  Procedure(s) Performed: Procedure(s): RIGHT Extreme Lateral Interbody Fusion Lumbar one-two, Lumbar two-three (Right) Hemilaminectomy Lumbar two-three Right, Posterior Fusion Lumbar one-two, two-three, Segmental fixation Lumbar one-three   Patient Location: PACU  Anesthesia Type:General  Level of Consciousness: sedated and responds to stimulation  Airway & Oxygen Therapy: Patient Spontanous Breathing and Patient connected to nasal cannula oxygen  Post-op Assessment: Report given to RN, Post -op Vital signs reviewed and stable, Patient moving all extremities and Patient moving all extremities X 4  Post vital signs: Reviewed and stable  Last Vitals: There were no vitals filed for this visit.  Complications: No apparent anesthesia complications

## 2015-02-22 NOTE — OR Nursing (Signed)
Nuvasive Stim needles in @ 2706

## 2015-02-22 NOTE — Anesthesia Preprocedure Evaluation (Addendum)
Anesthesia Evaluation  Patient identified by MRN, date of birth, ID band Patient awake    Reviewed: Allergy & Precautions, NPO status , Patient's Chart, lab work & pertinent test results  History of Anesthesia Complications (+) PONV, Family history of anesthesia reaction and history of anesthetic complications  Airway Mallampati: II  TM Distance: >3 FB Neck ROM: Full    Dental no notable dental hx.    Pulmonary pneumonia, former smoker,    Pulmonary exam normal breath sounds clear to auscultation       Cardiovascular hypertension, Pt. on medications Normal cardiovascular exam+ dysrhythmias  Rhythm:Regular Rate:Normal     Neuro/Psych  Headaches, negative psych ROS   GI/Hepatic negative GI ROS, Neg liver ROS,   Endo/Other  negative endocrine ROS  Renal/GU Renal disease     Musculoskeletal  (+) Arthritis ,   Abdominal   Peds  Hematology  (+) anemia ,   Anesthesia Other Findings   Reproductive/Obstetrics negative OB ROS                           Anesthesia Physical Anesthesia Plan  ASA: III  Anesthesia Plan: General   Post-op Pain Management:    Induction: Intravenous  Airway Management Planned: Oral ETT  Additional Equipment: None  Intra-op Plan:   Post-operative Plan: Extubation in OR  Informed Consent: I have reviewed the patients History and Physical, chart, labs and discussed the procedure including the risks, benefits and alternatives for the proposed anesthesia with the patient or authorized representative who has indicated his/her understanding and acceptance.   Dental advisory given  Plan Discussed with: CRNA  Anesthesia Plan Comments:         Anesthesia Quick Evaluation

## 2015-02-22 NOTE — Anesthesia Postprocedure Evaluation (Signed)
Anesthesia Post Note  Patient: Connie Barnes  Procedure(s) Performed: Procedure(s) (LRB): RIGHT Extreme Lateral Interbody Fusion Lumbar one-two, Lumbar two-three (Right) Hemilaminectomy Lumbar two-three Right, Posterior Fusion Lumbar one-two, two-three, Segmental fixation Lumbar one-three   Anesthesia type: General  Patient location: PACU  Post pain: Pain level controlled  Post assessment: Post-op Vital signs reviewed  Last Vitals: BP 162/80 mmHg  Pulse 87  Temp(Src) 36.4 C  Resp 19  SpO2 94%  Post vital signs: Reviewed  Level of consciousness: sedated  Complications: No apparent anesthesia complications

## 2015-02-22 NOTE — Op Note (Signed)
02/22/2015  2:44 PM  PATIENT:  Connie Barnes  69 y.o. female  PRE-OPERATIVE DIAGNOSIS:  1. 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 Degenerative disc disease L1 2, 3. Scoliosis, 4. Back and right leg pain  POST-OPERATIVE DIAGNOSIS:  same  PROCEDURE:  1. Anterolateral retroperitoneal interbody fusion L1-2 and L2-3 utilizing peek interbody cages packed with morcellized allograft, 2. Decompressive lumbar hemilaminectomy and medial facetectomy foraminotomies L2-3 on the right, 3. Posterior lateral fusion L1-2 and L2-3 utilizing morcellized allograft, 4. Segmental fixation L1-L3 inclusive utilizing cortical pedicle screws  SURGEON:  Sherley Bounds, MD  ASSISTANTS: Dr. Vertell Limber  ANESTHESIA:   General  EBL: 300 ml  Total I/O In: 7517 [I.V.:2200; IV Piggyback:250] Out: 700 [Urine:400; Blood:300]  BLOOD ADMINISTERED:none  DRAINS: Medium Hemovac   SPECIMEN:  No Specimen  INDICATION FOR PROCEDURE: This patient presented with a long history of low back pain with right anterior thigh pain. CT myelogram showed spinal stenosis at L1-2 and L2-3 with mobile retrolisthesis at both levels with severe degenerative disc disease at L1-2. She tried medical management without relief. I recommended decompression and instrumented fusion. Patient understood the risks, benefits, and alternatives and potential outcomes and wished to proceed.  PROCEDURE DETAILS: The patient was brought to the operating room and after induction of adequate generalized endotracheal anesthesia, the patient was positioned in the left lateral decubitus position exposing the right side. The patient was positioned in the typical XLIF fashion and taped into position and trajectories were confirmed with AP lateral fluoroscopy. The skin was cleaned and then prepped with DuraPrep and draped in the usual sterile fashion. 5 cc of local anesthesia was injected. A lateral incision was made over the appropriate disc space and  a incision was made posterior lateral to this. Blunt finger dissection was used to enter the retroperitoneal space. I could palpate the psoas musculature as well as the anterior face of the transverse process, and I could feel the fatty tissues of the retroperitoneum. I swept my finger up to the lateral incision and passed my first dilator down to psoas musculature over the L2-3 disc space utilizing my index finger. We then used EMG monitoring to monitor our dilator. We checked our position with fluoroscopy and then placed a K wire into the disc space, and then used sequential dilators until our final retractor was in place. We then positioned it utilizing AP lateral fluoroscopy, and used our ball probe to make sure there were no neural structures within the working channel. I then placed the intradiscal shim, and opened my retractor further. The annulus was then incised and the discectomy was done with pituitaries. The annulus was removed from the endplates with a Cobb and the opposite annulus was opened and released. I then used a series of scrapers and shavers to prepare the endplates, taking care not to violate the endplates. I then placed my 16 mm paddle across the disc space to further open opposite annulus and I checked the trajectory with AP and lateral fluoroscopy. I then used sequential trials to determine the correct size cage. The 10 trial fit the best, so a corresponding cage was selected and packed with morcellized allograft. Using sliders it was then tapped gently into the disc space utilizing AP fluoroscopy until it was appropriately positioned. I then irrigated with saline solution containing bacitracin and dried any bleeding points. I then removed our retractor and checked for any bleeding. I then used the same posterior lateral incision to extend my  finger between the 11th and 12th ribs and passed the first dilator down to the psoas musculature over L1-2. We then used the exact same technique to  place our final retractor over the L1-2 interspace. The L1-2 interspace was quite collapsed but we were able to get our shim into place and this distracted the disc space opened somewhat. I then prepared the endplates but most of the disc was our gone. Then passed our first 2 paddles across the opposite annulus to open the disc space further. I then used sequential trials but the 8 mm lordotic trial fit the best. We then impacted a corresponding cage with morcellized allograft and tapped this into position utilizing the slides at L1-2. I then irrigated with saline solution and dried any bleeding points. I checked my final construct with AP lateral fluoroscopy, removed my retractor, and closed the incisions with layers of 0 Vicryl in the fascia, 2-0 Vicryl in the subcutaneous tissues, and 3-0 Vicryl in the subcuticular tissues. The skin was closed with Dermabond. The drapes were then removed and the patient was turned into the prone position on chest rolls, all pressure points were padded. The lumbar region was then prepped with DuraPrep and draped in usual sterile fashion and 5 cc of local anesthesia was injected. A dorsal midline incision was made and carried down to the lumbosacral fascia. The fascia was opened and the paraspinous musculature was taken a subperiostel fashion to expose L1-L3. Intraoperative fluoroscopy confirmed my level. I then used AP fluoroscopy and surface landmarks to identify the entry point from a cortical pedicle screws. I then drilled and an operative outward direction and then tapped in the same direction. EMG monitoring was used. I placed 5-0 x 30 mm pedicle screws at L2, 25 mm screws at L1, and 35 mm screws at L3. I then checked my screw placement with AP and lateral fluoroscopy. I then performed a right L2-3 laminectomy medial facetectomy foraminotomies. The underlying yellow ligament was removed in piecemeal fashion to expose underlying dura and L3 nerve root and decompress the L3  nerve root as well as the lateral recess. I then palpated with a coronary dilator. I then irrigated with copious amounts of bacitracin containing saline solution. The posterior elements were decorticated with a high-speed drill and morcellized allograft was placed in order to perform a posterior fusion from L1-L3. The wound was irrigated, a Hemovac drain was placed, and the wound was then closed in layers of 0 Vicryl in the fascia, 2-0 Vicryl in the subcutaneous tissues, and 3-0 Vicryl in the subcuticular tissues. The skin was closed with benzoin and Steri-Strips. A sterile dressing was applied. The patient was then awakened from general anesthesia and transported to the recovery room in stable condition. At the end of the procedure all sponge, needle, and instrument counts were correct.   PLAN OF CARE: Admit to inpatient   PATIENT DISPOSITION:  PACU - hemodynamically stable.   Delay start of Pharmacological VTE agent (>24hrs) due to surgical blood loss or risk of bleeding:  yes

## 2015-02-23 ENCOUNTER — Encounter (HOSPITAL_COMMUNITY): Payer: Self-pay | Admitting: Neurological Surgery

## 2015-02-23 NOTE — Progress Notes (Signed)
Patient ID: Connie Barnes, female   DOB: 05/17/1945, 69 y.o.   MRN: [id number] Subjective: Patient reports appropriate back soreness. Minimal leg pain. She has walked the hall already. She just vomited. No abdominal pain.  Objective: Vital signs in last 24 hours: Temp:  [96.8 F (36 C)-98.4 F (36.9 C)] 98.4 F (36.9 C) (11/04 0615) Pulse Rate:  [76-90] 78 (11/04 0615) Resp:  [14-23] 18 (11/04 0615) BP: (123-171)/(45-85) 125/46 mmHg (11/04 0615) SpO2:  [89 %-95 %] 94 % (11/04 0615)  Intake/Output from previous day: 11/03 0701 - 11/04 0700 In: 3150 [I.V.:2900; IV Piggyback:250] Out: 1900 [Urine:1325; Drains:100; Blood:475] Intake/Output this shift: Total I/O In: 3 [I.V.:3] Out: -   Neurologic: Grossly normal  Lab Results: Lab Results  Component Value Date   WBC 7.2 02/14/2015   HGB 14.6 02/14/2015   HCT 43.0 02/14/2015   MCV 93.5 02/14/2015   PLT 190 02/14/2015   Lab Results  Component Value Date   INR 0.90 02/14/2015   BMET Lab Results  Component Value Date   NA 140 02/14/2015   K 3.5 02/14/2015   CL 103 02/14/2015   CO2 24 02/14/2015   GLUCOSE 94 02/14/2015   BUN 15 02/14/2015   CREATININE 0.76 02/14/2015   CALCIUM 9.5 02/14/2015    Studies/Results: Dg Lumbar Spine 2-3 Views  02/22/2015  CLINICAL DATA:  Lumbar fusion. EXAM: DG C-ARM GT 120 MIN; LUMBAR SPINE - 2-3 VIEW CONTRAST:  None. FLUOROSCOPY TIME:  Fluoroscopy Time (in minutes and seconds): 3 minutes 24 seconds Number of Acquired Images:  2 COMPARISON:  CT 01/18/2015. FINDINGS: Vertebral bodies are difficult to count due to position. Posterior cortical screws and inter disc fusion devices noted. Good anatomic alignment. IMPRESSION: Postsurgical changes lumbar spine.  Good anatomic alignment. Electronically Signed   By: Lincoln   On: 02/22/2015 14:56   Dg C-arm Gt 120 Min  02/22/2015  CLINICAL DATA:  Lumbar fusion. EXAM: DG C-ARM GT 120 MIN; LUMBAR SPINE - 2-3 VIEW CONTRAST:  None. FLUOROSCOPY  TIME:  Fluoroscopy Time (in minutes and seconds): 3 minutes 24 seconds Number of Acquired Images:  2 COMPARISON:  CT 01/18/2015. FINDINGS: Vertebral bodies are difficult to count due to position. Posterior cortical screws and inter disc fusion devices noted. Good anatomic alignment. IMPRESSION: Postsurgical changes lumbar spine.  Good anatomic alignment. Electronically Signed   By: Marcello Moores  Register   On: 02/22/2015 14:56    Assessment/Plan: Overall seems to be doing very well postop day 1   LOS: 1 day    Connie Barnes 02/23/2015, 10:15 AM

## 2015-02-23 NOTE — Progress Notes (Signed)
Patients blood pressure has run low for her all day she is concerned, she is asymptomatic will continue to monitor.

## 2015-02-23 NOTE — Evaluation (Signed)
Occupational Therapy Evaluation Patient Details Name: Connie Barnes MRN: [id number] DOB: 03-09-1946 Today's Date: 02/23/2015    History of Present Illness Pt is a 69 y/o female who presents s/p L1-L3 ALIF on 02/22/15.   Clinical Impression   Pt was independent prior to admission.  Presents with pain, decreased balance and generalized weakness interfering with ability to perform self care and mobility at her baseline.  Pt has all necessary DME and AE from previous hip surgery. Will follow acutely to address ADL transfers and review use of AE.    Follow Up Recommendations  No OT follow up;Supervision/Assistance - 24 hour    Equipment Recommendations  None recommended by OT    Recommendations for Other Services       Precautions / Restrictions Precautions Precautions: Fall;Back Precaution Booklet Issued: Yes (comment) Precaution Comments: reviewed precautions related to ADL and IADL Required Braces or Orthoses: Spinal Brace Spinal Brace: Lumbar corset;Applied in sitting position Restrictions Weight Bearing Restrictions: No      Mobility Bed Mobility Overal bed mobility: Needs Assistance Bed Mobility: Rolling;Sidelying to Sit;Sit to Sidelying Rolling: Supervision Sidelying to sit: Min assist     Sit to sidelying: Min assist General bed mobility comments: assist to elevate trunk with side to sit, assist for LEs with sit to side  Transfers Overall transfer level: Needs assistance Equipment used: Rolling walker (2 wheeled) Transfers: Sit to/from Stand Sit to Stand: Min guard         General transfer comment: no physical assist, pt self cued for technique    Balance Overall balance assessment: Needs assistance Sitting-balance support: Feet supported;No upper extremity supported Sitting balance-Leahy Scale: Fair     Standing balance support: No upper extremity supported Standing balance-Leahy Scale: Fair Standing balance comment: Pt stood without UE support to  vomit at end of gait training. No LOB.                             ADL Overall ADL's : Needs assistance/impaired Eating/Feeding: Independent;Sitting   Grooming: Wash/dry hands;Min guard;Standing   Upper Body Bathing: Minimal assitance;Sitting   Lower Body Bathing: Minimal assistance;Sit to/from stand   Upper Body Dressing : Minimal assistance;Sitting Upper Body Dressing Details (indicate cue type and reason): back brace Lower Body Dressing: Minimal assistance;Sit to/from stand   Toilet Transfer: Min guard;Ambulation;BSC (over toilet)   Toileting- Clothing Manipulation and Hygiene: Min guard;Sit to/from stand       Functional mobility during ADLs: Min guard;Rolling walker General ADL Comments: Pt not quite able to cross foot over opposite knee to reach feet     Vision     Perception     Praxis      Pertinent Vitals/Pain Pain Assessment: 0-10 Pain Score: 5  Faces Pain Scale: Hurts even more Pain Location: L groin Pain Descriptors / Indicators: Aching Pain Intervention(s): Limited activity within patient's tolerance;Monitored during session;Premedicated before session;Repositioned     Hand Dominance Right   Extremity/Trunk Assessment Upper Extremity Assessment Upper Extremity Assessment: Overall WFL for tasks assessed   Lower Extremity Assessment Lower Extremity Assessment: Defer to PT evaluation   Cervical / Trunk Assessment Cervical / Trunk Assessment: Normal   Communication Communication Communication: No difficulties   Cognition Arousal/Alertness: Awake/alert Behavior During Therapy: WFL for tasks assessed/performed Overall Cognitive Status: Within Functional Limits for tasks assessed                     General Comments  Exercises       Shoulder Instructions      Home Living Family/patient expects to be discharged to:: Private residence Living Arrangements: Spouse/significant other Available Help at Discharge:  Family;Available 24 hours/day Type of Home: House Home Access: Stairs to enter [employer] of Steps: 11   Home Layout: One level     Bathroom Shower/Tub: Occupational psychologist: Standard Bathroom Accessibility: Yes   Home Equipment: Environmental consultant - 2 wheels;Bedside commode;Adaptive equipment;Shower seat - built in [employer] Equipment: Reacher;Sock aid;Long-handled shoe horn;Long-handled sponge Additional Comments: equipment left over from hip replacement sx      Prior Functioning/Environment Level of Independence: Independent             OT Diagnosis: Generalized weakness;Acute pain   OT Problem List: Decreased strength;Impaired balance (sitting and/or standing);Decreased activity tolerance;Pain;Decreased knowledge of use of DME or AE   OT Treatment/Interventions: Self-care/ADL training;DME and/or AE instruction;Patient/family education;Balance training    OT Goals(Current goals can be found in the care plan section) Acute Rehab OT Goals Patient Stated Goal: Home OT Goal Formulation: With patient Time For Goal Achievement: 03/02/15 Potential to Achieve Goals: Good ADL Goals Pt Will Perform Grooming: with supervision;standing Pt Will Perform Lower Body Bathing: with supervision;sit to/from stand;with adaptive equipment Pt Will Perform Lower Body Dressing: with supervision;with adaptive equipment;sit to/from stand Pt Will Transfer to Toilet: with supervision;ambulating;bedside commode (over toilet) Pt Will Perform Toileting - Clothing Manipulation and hygiene: with supervision;sit to/from stand Pt Will Perform Tub/Shower Transfer: Shower transfer;ambulating;3 in 1;rolling walker Additional ADL Goal #1: Pt will generalize back precautions in ADL and mobility independently.  OT Frequency: Min 2X/week   Barriers to D/C:            Co-evaluation              End of Session Equipment Utilized During Treatment: Gait belt;Rolling walker;Back  brace Nurse Communication:  (IV complete)  Activity Tolerance: Patient limited by pain Patient left: in bed;with call bell/phone within reach;with bed alarm set   Time: 5859-2924 OT Time Calculation (min): 22 min Charges:  OT General Charges $OT Visit: 1 Procedure OT Evaluation $Initial OT Evaluation Tier I: 1 Procedure G-Codes:    Malka So 02/23/2015, 12:21 PM  [phone]

## 2015-02-23 NOTE — Evaluation (Signed)
Physical Therapy Evaluation Patient Details Name: Connie Barnes MRN: [id number] DOB: 08-13-1945 Today's Date: 02/23/2015   History of Present Illness  Pt is a 69 y/o female who presents s/p L1-L3 ALIF on 02/22/15.  Clinical Impression  Pt admitted with above diagnosis. Pt currently with functional limitations due to the deficits listed below (see PT Problem List). At the time of PT eval pt was able to perform transfers and ambulation with hands-on guarding for assist. Pt vomited a large amount during gait training and reportedly felt better after. Currently recommending HHPT, however depending on progress with PT may be more appropriate for outpatient follow up by the time she is ready for d/c. Pt will benefit from skilled PT to increase their independence and safety with mobility to allow discharge to the venue listed below.       Follow Up Recommendations Home health PT;Supervision for mobility/OOB    Equipment Recommendations  Rolling walker with 5" wheels;3in1 (PT)    Recommendations for Other Services       Precautions / Restrictions Precautions Precautions: Fall;Back Precaution Booklet Issued: Yes (comment) Precaution Comments: Pt and husband were educated on 3/3 back precautions. Reinforced throughout session.  Required Braces or Orthoses: Spinal Brace Spinal Brace: Lumbar corset;Applied in sitting position Restrictions Weight Bearing Restrictions: No      Mobility  Bed Mobility Overal bed mobility: Needs Assistance Bed Mobility: Rolling;Sidelying to Sit Rolling: Supervision Sidelying to sit: Min assist       General bed mobility comments: Pt was able to transition to EOB with assist to elevate trunk to full sitting position. Heavy use of rails.   Transfers Overall transfer level: Needs assistance Equipment used: Rolling walker (2 wheeled) Transfers: Sit to/from Stand Sit to Stand: Min guard         General transfer comment: VC's for hand placement on seated  surface for safety. Pt was able to power-up to full standing without physical assist, however hands-on guarding was provided for safety.   Ambulation/Gait Ambulation/Gait assistance: Min guard Ambulation Distance (Feet): 100 Feet Assistive device: Rolling walker (2 wheeled) Gait Pattern/deviations: Step-through pattern;Decreased stride length;Trunk flexed Gait velocity: Decreased Gait velocity interpretation: Below normal speed for age/gender General Gait Details: Slow and guarded gait. Wanted reassurance that she was doing well throughout gait training. Close guard provided for safety.   Stairs            Wheelchair Mobility    Modified Rankin (Stroke Patients Only)       Balance Overall balance assessment: Needs assistance Sitting-balance support: Feet supported;No upper extremity supported Sitting balance-Leahy Scale: Fair     Standing balance support: No upper extremity supported Standing balance-Leahy Scale: Fair Standing balance comment: Pt stood without UE support to vomit at end of gait training. No LOB.                              Pertinent Vitals/Pain Pain Assessment: Faces Faces Pain Scale: Hurts even more Pain Location: Incision Pain Descriptors / Indicators: Operative site guarding Pain Intervention(s): Limited activity within patient's tolerance;Monitored during session;Repositioned    Home Living Family/patient expects to be discharged to:: Private residence Living Arrangements: Spouse/significant other Available Help at Discharge: Family;Available 24 hours/day Type of Home: House Home Access: Stairs to enter   [employer] of Steps: 11 Home Layout: One level        Prior Function Level of Independence: Independent  Hand Dominance   Dominant Hand: Right    Extremity/Trunk Assessment   Upper Extremity Assessment: Defer to OT evaluation           Lower Extremity Assessment: Generalized  weakness      Cervical / Trunk Assessment: Normal  Communication   Communication: No difficulties  Cognition Arousal/Alertness: Awake/alert Behavior During Therapy: WFL for tasks assessed/performed Overall Cognitive Status: Within Functional Limits for tasks assessed                      General Comments      Exercises        Assessment/Plan    PT Assessment Patient needs continued PT services  PT Diagnosis Difficulty walking;Acute pain   PT Problem List Decreased strength;Decreased range of motion;Decreased activity tolerance;Decreased balance;Decreased mobility;Decreased knowledge of use of DME;Decreased safety awareness;Decreased knowledge of precautions;Pain  PT Treatment Interventions DME instruction;Gait training;Stair training;Functional mobility training;Therapeutic activities;Therapeutic exercise;Neuromuscular re-education;Patient/family education   PT Goals (Current goals can be found in the Care Plan section) Acute Rehab PT Goals Patient Stated Goal: Home PT Goal Formulation: With patient/family Time For Goal Achievement: 03/02/15 Potential to Achieve Goals: Good    Frequency Min 5X/week   Barriers to discharge        Co-evaluation               End of Session Equipment Utilized During Treatment: Back brace Activity Tolerance: Patient tolerated treatment well (Limited by N/V) Patient left: in chair;with call bell/phone within reach;with chair alarm set;with family/visitor present;with nursing/sitter in room Nurse Communication: Mobility status         Time: 0921-1006 PT Time Calculation (min) (ACUTE ONLY): 45 min   Charges:   PT Evaluation $Initial PT Evaluation Tier I: 1 Procedure PT Treatments $Gait Training: 23-37 mins   PT G Codes:        Rolinda Roan 2015/03/07, 10:21 AM   Rolinda Roan, PT, DPT Acute Rehabilitation Services Pager: [phone]

## 2015-02-24 MED ORDER — POLYETHYLENE GLYCOL 3350 17 G PO PACK
17.0000 g | PACK | Freq: Every day | ORAL | Status: DC
  Administered 2015-02-24: 17 g via ORAL
  Filled 2015-02-24 (×2): qty 1

## 2015-02-24 NOTE — Progress Notes (Signed)
Physical Therapy Treatment Patient Details Name: Connie Barnes MRN: [id number] DOB: [birth year] Today's Date: 02/24/2015    History of Present Illness Pt is a 69 y/o female who presents s/p L1-L3 ALIF on 02/22/15.    PT Comments    Pt making steady progress toward goals.  Follow Up Recommendations  Home health PT;Supervision for mobility/OOB     Equipment Recommendations  Rolling walker with 5" wheels;3in1 (PT)    Precautions / Restrictions Precautions Precautions: Fall;Back Precaution Comments: pt able to recall 2/3 precautions. reviewed all 3 with pt Required Braces or Orthoses: Spinal Brace Spinal Brace: Lumbar corset;Applied in sitting position (supervision with cues to don brace) Restrictions Weight Bearing Restrictions: No    Mobility  Bed Mobility Overal bed mobility: Needs Assistance   Rolling: Min guard Sidelying to sit: Min guard       General bed mobility comments: with bed flat and no rails: cues on sequencing and technique to assist with pt adhering to back precautions.  Transfers Overall transfer level: Needs assistance Equipment used: Rolling walker (2 wheeled) Transfers: Sit to/from Stand Sit to Stand: Supervision         General transfer comment: cues on hand placement for safety  Ambulation/Gait Ambulation/Gait assistance: Supervision Ambulation Distance (Feet): 130 Feet Assistive device: Rolling walker (2 wheeled) Gait Pattern/deviations: Step-through pattern;Decreased stride length;Narrow base of support;Trunk flexed Gait velocity: Decreased Gait velocity interpretation: Below normal speed for age/gender General Gait Details: continues to demo slow, guarded gait pattern. no balance issues noted.   Stairs         General stair comments: deferred today due to pt still on IV antibiotics and needs to practice full flight of stairs. due to antibiotics RN unable to lock IV for safe stair instructions.      Cognition Arousal/Alertness:  Awake/alert Behavior During Therapy: WFL for tasks assessed/performed Overall Cognitive Status: Within Functional Limits for tasks assessed           Pertinent Vitals/Pain Pain Assessment: 0-10 Pain Score: 6  Pain Location: back and left groion/leg Pain Descriptors / Indicators: Aching;Sore;Discomfort Pain Intervention(s): Monitored during session;Premedicated before session;Repositioned;Limited activity within patient's tolerance     PT Goals (current goals can now be found in the care plan section) Acute Rehab PT Goals Patient Stated Goal: Home PT Goal Formulation: With patient/family Time For Goal Achievement: 03/02/15 Potential to Achieve Goals: Good Progress towards PT goals: Progressing toward goals    Frequency  Min 5X/week    PT Plan Current plan remains appropriate    End of Session Equipment Utilized During Treatment: Gait belt;Back brace Activity Tolerance: Patient tolerated treatment well Patient left: in chair;with call bell/phone within reach;with family/visitor present     Time: 0211-1552 PT Time Calculation (min) (ACUTE ONLY): 23 min  Charges:  $Gait Training: 8-22 mins $Therapeutic Activity: 8-22 mins                     Willow Ora 02/24/2015, 10:37 PM  Willow Ora, PTA, CLT Acute Rehab Services Office365 [phone] 02/24/15, 10:38 PM

## 2015-02-24 NOTE — Progress Notes (Signed)
OT Cancellation Note  Patient Details Name: Connie Barnes MRN: [id number] DOB: 11-Oct-1945   Cancelled Treatment:    Reason Eval/Treat Not Completed: Pain limiting ability to participate;Fatigue/lethargy limiting ability to participate. Pt. Reports she sat up in recliner this am for 1.5 hrs. And has just returned to bed secondary to spasms/pain.  Will attempt back later as time permits.    Janice Coffin, COTA/L 02/24/2015, 9:28 AM

## 2015-02-24 NOTE — Progress Notes (Signed)
Patient ID: Connie Barnes, female   DOB: [birth year], 69 y.o.   MRN: [id number] Afeb, vss Was doing well til this morning when she developed some generalized pain. Will see how she feels tomorrow and hopefully discharge then.

## 2015-02-25 LAB — CREATININE, SERUM
CREATININE: 1.28 mg/dL — AB (ref 0.44–1.00)
GFR calc Af Amer: 48 mL/min — ABNORMAL LOW (ref >60)
GFR, EST NON AFRICAN AMERICAN: 41 mL/min — AB (ref >60)

## 2015-02-25 MED ORDER — METHOCARBAMOL 500 MG PO TABS: 500.0000 mg | ORAL_TABLET | Freq: Four times a day (QID) | ORAL | Status: AC | PRN

## 2015-02-25 MED ORDER — OXYCODONE-ACETAMINOPHEN 5-325 MG PO TABS: 1.0000 | ORAL_TABLET | ORAL | Status: DC | PRN

## 2015-02-25 NOTE — Care Management Note (Signed)
Case Management Note  Patient Details  Name: Connie Barnes MRN: [id number] Date of Birth: 12/08/1945  Subjective/Objective:                   L1-L3 ALIF Action/Plan: Discharge planning  Expected Discharge Date:  02/25/15               Expected Discharge Plan:  [place]  In-House Referral:     Discharge planning Services  CM Consult  Post Acute Care Choice:  Home Health Choice offered to:  Patient  DME Arranged:  3-N-1, Walker rolling DME Agency:  [place]:  PT [facility] Agency:  [place]  Status of Service:  Completed, signed off  Medicare Important Message Given:    Date Medicare IM Given:    Medicare IM give by:    Date Additional Medicare IM Given:    Additional Medicare Important Message give by:     If discussed at [place] of Stay Meetings, dates discussed:    Additional Comments: CM spoke with pt to offer choice of home health agency.  Pt chooses [place] and CM informed pt SOC is backed up until Wednesday (per [facility] rep, Amy).  Pt states she does not want another agency and Cm called referral to Amy for HHPT.  Cm called [facility] DME rep, Merry Proud to please deliver the rolling walker and 3n1 to roomprior to discharge.  No other CM needs were communicated. Dellie Catholic, RN 02/25/2015, 1:04 PM

## 2015-02-25 NOTE — Progress Notes (Signed)
DC instructions and prescription given to patient. All questions answered. Reviewed back precautions with patient. Instructed patient to call MD in am for instructions on when to shower. Patient in [facility] to be escorted by staff to lobby. Husband to transport home in private vehicle.

## 2015-02-25 NOTE — Progress Notes (Signed)
No acute events. Ambulating well AVSS Full strength bilateral lower extremities Incisions clean, dry, intact Stable Discharge home

## 2015-02-25 NOTE — Progress Notes (Signed)
Physical Therapy Treatment Patient Details Name: Connie Barnes MRN: [id number] DOB: 08/30/1945 Today's Date: 02/25/2015    History of Present Illness Pt is a 69 y/o female who presents s/p L1-L3 ALIF on 02/22/15.    PT Comments    Pt pleasant & willing to participate in PT session.  Required increased assistance to exit/enter bed from Lt side today compared to previous sessions but was able to transfer & amb with supervision, & completed stair training with CGA.  Patient safe to D/C from a mobility standpoint based on progression towards goals set on PT eval.    Follow Up Recommendations  Home health PT;Supervision for mobility/OOB     Equipment Recommendations  Rolling walker with 5" wheels;3in1 (PT)    Recommendations for Other Services       Precautions / Restrictions Precautions Precautions: Fall;Back Precaution Comments: pt able to recall 2/3 precautions. reviewed all 3 with pt Required Braces or Orthoses: Spinal Brace Spinal Brace: Lumbar corset;Applied in sitting position    Mobility  Bed Mobility Overal bed mobility: Needs Assistance Bed Mobility: Rolling;Sidelying to Sit;Sit to Sidelying Rolling: Min assist (Lt side) Sidelying to sit: Mod assist     Sit to sidelying: Min assist General bed mobility comments: exited/entered bed from Lt side requiring increased assistance compared to previous session.  Cues for technique & back precautions  Transfers Overall transfer level: Needs assistance Equipment used: Rolling walker (2 wheeled) Transfers: Sit to/from Stand Sit to Stand: Supervision         General transfer comment: cues on hand placement for safety  Ambulation/Gait Ambulation/Gait assistance: Supervision Ambulation Distance (Feet): 100 Feet Assistive device: Rolling walker (2 wheeled) Gait Pattern/deviations: Step-through pattern;Decreased stride length Gait velocity: Decreased   General Gait Details: slow guarded gait but steady.      Stairs Stairs: Yes Stairs assistance: Min guard Stair Management: One rail Right;Forwards;Step to pattern Number of Stairs: 12 General stair comments: cues for sequencing  Wheelchair Mobility    Modified Rankin (Stroke Patients Only)       Balance                                    Cognition Arousal/Alertness: Awake/alert Behavior During Therapy: WFL for tasks assessed/performed Overall Cognitive Status: Within Functional Limits for tasks assessed                      Exercises      General Comments        Pertinent Vitals/Pain Pain Assessment: 0-10 Pain Score: 4  Pain Location: back Pain Descriptors / Indicators: Aching Pain Intervention(s): Monitored during session;Premedicated before session;Repositioned    Home Living                      Prior Function            PT Goals (current goals can now be found in the care plan section) Acute Rehab PT Goals Patient Stated Goal: Home PT Goal Formulation: With patient/family Time For Goal Achievement: 03/02/15 Potential to Achieve Goals: Good Progress towards PT goals: Progressing toward goals    Frequency  Min 5X/week    PT Plan Current plan remains appropriate    Co-evaluation             End of Session Equipment Utilized During Treatment: Gait belt;Back brace Activity Tolerance: Patient tolerated treatment well Patient left: in chair;with  call bell/phone within reach;with family/visitor present     Time: 1153-1209 PT Time Calculation (min) (ACUTE ONLY): 16 min  Charges:  $Gait Training: 8-22 mins                    G Codes:      Sena Hitch 02/25/2015, 12:23 PM   Sarajane Marek, PTA [phone] 02/25/2015

## 2015-02-25 NOTE — Discharge Summary (Signed)
Date of admission: 02/22/2015  Date of discharge: 02/25/2015  PRE-OPERATIVE DIAGNOSIS: 1. 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 Degenerative disc disease L1 2, 3. Scoliosis, 4. Back and right leg pain  POST-OPERATIVE DIAGNOSIS: same  PROCEDURE: 1. Anterolateral retroperitoneal interbody fusion L1-2 and L2-3 utilizing peek interbody cages packed with morcellized allograft, 2. Decompressive lumbar hemilaminectomy and medial facetectomy foraminotomies L2-3 on the right, 3. Posterior lateral fusion L1-2 and L2-3 utilizing morcellized allograft, 4. Segmental fixation L1-L3 inclusive utilizing cortical pedicle screws  Attending physician: Sherley Bounds M.D.  Hospital course: She was admitted to the hospital the day surgery taken the operating room for the above listed procedure. She tolerated this well. She had an uncomplicated postoperative course. She is discharged home on postoperative day 3 in stable medical neurological condition.  Discharge medications: Resume prior medications, Percocet for pain, Robaxin for muscle spasms  Follow-up: Dr. Ronnald Ramp

## 2015-03-11 ENCOUNTER — Emergency Department (HOSPITAL_BASED_OUTPATIENT_CLINIC_OR_DEPARTMENT_OTHER)
Admission: EM | Admit: 2015-03-11 | Discharge: 2015-03-11 | Disposition: A | Discharge status: Home / Self Care | Payer: Medicare Other | Attending: Emergency Medicine | Admitting: Emergency Medicine

## 2015-03-11 ENCOUNTER — Encounter (HOSPITAL_BASED_OUTPATIENT_CLINIC_OR_DEPARTMENT_OTHER): Payer: Self-pay | Admitting: *Deleted

## 2015-03-11 DIAGNOSIS — R319 Hematuria, unspecified: Secondary | ICD-10-CM | POA: Diagnosis present

## 2015-03-11 DIAGNOSIS — I4891 Unspecified atrial fibrillation: Secondary | ICD-10-CM | POA: Diagnosis not present

## 2015-03-11 DIAGNOSIS — Z880 Allergy status to penicillin: Secondary | ICD-10-CM | POA: Diagnosis not present

## 2015-03-11 DIAGNOSIS — Z7982 Long term (current) use of aspirin: Secondary | ICD-10-CM | POA: Diagnosis not present

## 2015-03-11 DIAGNOSIS — M1990 Unspecified osteoarthritis, unspecified site: Secondary | ICD-10-CM | POA: Diagnosis not present

## 2015-03-11 DIAGNOSIS — Z792 Long term (current) use of antibiotics: Secondary | ICD-10-CM | POA: Insufficient documentation

## 2015-03-11 DIAGNOSIS — I10 Essential (primary) hypertension: Secondary | ICD-10-CM | POA: Diagnosis not present

## 2015-03-11 DIAGNOSIS — N390 Urinary tract infection, site not specified: Secondary | ICD-10-CM | POA: Diagnosis not present

## 2015-03-11 DIAGNOSIS — Z8701 Personal history of pneumonia (recurrent): Secondary | ICD-10-CM | POA: Diagnosis not present

## 2015-03-11 DIAGNOSIS — Z862 Personal history of diseases of the blood and blood-forming organs and certain disorders involving the immune mechanism: Secondary | ICD-10-CM | POA: Diagnosis not present

## 2015-03-11 DIAGNOSIS — Z79899 Other long term (current) drug therapy: Secondary | ICD-10-CM | POA: Insufficient documentation

## 2015-03-11 DIAGNOSIS — Z87442 Personal history of urinary calculi: Secondary | ICD-10-CM | POA: Diagnosis not present

## 2015-03-11 DIAGNOSIS — Z87891 Personal history of nicotine dependence: Secondary | ICD-10-CM | POA: Insufficient documentation

## 2015-03-11 LAB — URINALYSIS, ROUTINE W REFLEX MICROSCOPIC
BILIRUBIN URINE: NEGATIVE
Glucose, UA: NEGATIVE mg/dL
KETONES UR: NEGATIVE mg/dL
Nitrite: NEGATIVE
PH: 6 (ref 5.0–8.0)
Protein, ur: >300 mg/dL — AB
SPECIFIC GRAVITY, URINE: 1.023 (ref 1.005–1.030)

## 2015-03-11 LAB — URINE MICROSCOPIC-ADD ON

## 2015-03-11 MED ORDER — CEPHALEXIN 500 MG PO CAPS: 500.0000 mg | ORAL_CAPSULE | Freq: Four times a day (QID) | ORAL | Status: DC

## 2015-03-11 NOTE — Discharge Instructions (Signed)
Take antibiotic as directed. Your symptoms should be improving in 2 days. If not return or follow-up with your doctor. Return for any new or worse symptoms.

## 2015-03-11 NOTE — ED Notes (Signed)
Friday am, began having frequent urination and burning. States attempted to call urologist, but did not rec a call back (Dr Estill Dooms, MD - [facility])

## 2015-03-11 NOTE — ED Notes (Signed)
Pt states difficult to sit due to issues today.

## 2015-03-11 NOTE — ED Notes (Signed)
DC instructions reviewed with pt, discussed importance of completing all of abx prescribed by EDP, also to follow up with primary MD as per EDP recommendations. Opportunity for questions provided

## 2015-03-11 NOTE — ED Provider Notes (Signed)
CSN: [id number]     Arrival date & time 03/11/15  1040 History   First MD Initiated Contact with Patient 03/11/15 1113     Chief Complaint  Patient presents with  . Hematuria     (Consider location/radiation/quality/duration/timing/severity/associated sxs/prior Treatment) Patient is a 69 y.o. female presenting with hematuria. The history is provided by the patient.  Hematuria Pertinent negatives include no chest pain, no abdominal pain, no headaches and no shortness of breath.   patient with complaint of blood in the urine urinary frequency and burning that started the few days ago. No fevers no nausea vomiting. Patient does have some residual back pain recently had back surgery. Patient states in the past when she's had the symptoms of the consistent with urinary tract infection.  Past Medical History  Diagnosis Date  . Hypertension   . Atrial fibrillation ([place])   . Family history of adverse reaction to anesthesia     belligerant sister  . Pneumonia     hx  . History of kidney stones     have numerous stones at present  . Headache   . Arthritis   . Anemia     hx child  . Complication of anesthesia     long time to get over anesthesia, very belligerant,very small veins per pt  . PONV (postoperative nausea and vomiting)   . Dysrhythmia     atrial fib episode after lithotripsy 2010. saw dr Johnanna Schneiders cardiology no visit since   Past Surgical History  Procedure Laterality Date  . Abdominal hysterectomy    . Bladder suspension  12  . Joint replacement  07    rt hip   . Excision morton's neuroma Left     foot  . Dilation and curettage of uterus    . Lipoma excision      back   . Shoulder arthroscopy Right 14  . Tonsillectomy      69 yrs old  . Anterior lat lumbar fusion Right 02/22/2015    Procedure: RIGHT Extreme Lateral Interbody Fusion Lumbar one-two, Lumbar two-three;  Surgeon: Eustace Moore, MD;  Location: [facility]S;  Service: Neurosurgery;  Laterality:  Right;  . Laminectomy with posterior lateral arthrodesis level 2  02/22/2015    Procedure: Hemilaminectomy Lumbar two-three Right, Posterior Fusion Lumbar one-two, two-three, Segmental fixation Lumbar one-three ;  Surgeon: Eustace Moore, MD;  Location: [facility] NEUR[facility] ORS;  Service: Neurosurgery;;   History reviewed. No pertinent family history. Social History  Substance Use Topics  . Smoking status: Former Smoker -- 0.50 packs/day for 7 years    Types: Cigarettes    Quit date: 02/14/2003  . Smokeless tobacco: None  . Alcohol Use: 4.2 oz/week    7 Glasses of wine per week     Comment: gin aand tonic occ , beer occ   OB History    No data available     Review of Systems  Constitutional: Negative for fever.  HENT: Negative for congestion.   Eyes: Negative for redness.  Respiratory: Negative for shortness of breath.   Cardiovascular: Negative for chest pain.  Gastrointestinal: Negative for nausea, vomiting and abdominal pain.  Genitourinary: Positive for dysuria, frequency and hematuria.  Musculoskeletal: Positive for back pain.  Skin: Negative for rash.  Neurological: Negative for headaches.  Hematological: Does not bruise/bleed easily.  Psychiatric/Behavioral: Negative for confusion.      Allergies  Ciprofloxacin hcl; Levofloxacin; Morphine and related; Talwin; Trazodone and nefazodone; Amoxicillin; Tetracyclines & related; and Zanaflex  Home Medications   Prior to Admission medications   Medication Sig Start Date End Date Taking? Authorizing Provider  acetaminophen (TYLENOL) 500 MG tablet Take 1,000 mg by mouth every 6 (six) hours as needed. pain     Historical Provider, MD  acyclovir (ZOVIRAX) 800 MG tablet Take 800 mg by mouth daily.      Historical Provider, MD  aspirin 81 MG EC tablet Take 81 mg by mouth daily.      Historical Provider, MD  betamethasone dipropionate (DIPROLENE) 0.05 % cream Apply topically as needed.    Historical Provider, MD  cephALEXin (KEFLEX) 500 MG  capsule Take 1 capsule (500 mg total) by mouth 4 (four) times daily. 03/11/15   Fredia Sorrow, MD  cetirizine (ZYRTEC) 10 MG tablet Take 10 mg by mouth daily.      Historical Provider, MD  Cholecalciferol (VITAMIN D) 1000 UNITS capsule Take 2,000 Units by mouth daily.     Historical Provider, MD  clindamycin (CLEOCIN) 300 MG capsule Take 300 mg by mouth See admin instructions. 300mg  prior to dental procedures    Historical Provider, MD  clobetasol cream (TEMOVATE) [id number] % Apply 1 application topically as needed.    Historical Provider, MD  estradiol (ESTRACE) 0.5 MG tablet Take 0.5 mg by mouth daily.     Historical Provider, MD  gabapentin (NEURONTIN) 100 MG capsule TAKE 1 CAPSULE BY MOUTH AT BEDTIME AND INCREASE TO 3 CAPSULES AS DIRECTED 05/25/14   Historical Provider, MD  ibuprofen (ADVIL,MOTRIN) 200 MG tablet Take 800 mg by mouth every 6 (six) hours as needed. pain     Historical Provider, MD  loratadine (CLARITIN) 10 MG tablet Take 10 mg by mouth daily as needed for allergies.     Historical Provider, MD  losartan (COZAAR) 25 MG tablet TAKE 1 TABLET BY MOUTH DAILY 11/30/14   Historical Provider, MD  methocarbamol (ROBAXIN) 500 MG tablet Take 1 tablet (500 mg total) by mouth every 6 (six) hours as needed for muscle spasms. 02/25/15   Kevan Ny Ditty, MD  multivitamin-iron-minerals-folic acid (CENTRUM) chewable tablet Chew 1 tablet by mouth daily.     Historical Provider, MD  nabumetone (RELAFEN) 500 MG tablet take 1 tablet by mouth daily as needed for muscle spasms    Historical Provider, MD  Olopatadine HCl (PATADAY) 0.2 % SOLN Place 1 drop into both eyes 2 (two) times daily as needed (dry eyes). Seasonal allergies    Historical Provider, MD  oxyCODONE-acetaminophen (PERCOCET/ROXICET) 5-325 MG tablet Take 1-2 tablets by mouth every 4 (four) hours as needed for moderate pain. 02/25/15   Kevan Ny Ditty, MD  Polyethyl Glycol-Propyl Glycol (SYSTANE OP) Place 1 drop into both eyes 2 (two) times  daily as needed. Dry eyes     Historical Provider, MD  simvastatin (ZOCOR) 10 MG tablet Take 10 mg by mouth daily at 6 PM.  11/02/14 11/02/15  Historical Provider, MD  tiZANidine (ZANAFLEX) 4 MG tablet Take 4 mg by mouth every 8 (eight) hours as needed for muscle spasms.     Historical Provider, MD  triamcinolone cream (KENALOG) 0.1 % Apply 1 application topically daily as needed (irritation).     Historical Provider, MD  triamterene-hydrochlorothiazide (DYAZIDE) 37.5-25 MG per capsule Take 1 capsule by mouth every morning.      Historical Provider, MD   BP 127/56 mmHg  Pulse 110  Temp(Src) 97.6 F (36.4 C) (Oral)  Resp 20  Ht 5\' 4"  (1.626 m)  Wt 153 lb (69.4 kg)  BMI  26.25 kg/m2  SpO2 100% Physical Exam  Constitutional: She is oriented to person, place, and time. She appears well-developed and well-nourished. No distress.  HENT:  Head: Normocephalic and atraumatic.  Mouth/Throat: Oropharynx is clear and moist.  Eyes: Conjunctivae and EOM are normal. Pupils are equal, round, and reactive to light.  Neck: Normal range of motion.  Cardiovascular: Normal rate, regular rhythm and normal heart sounds.   No murmur heard. Pulmonary/Chest: Effort normal and breath sounds normal. No respiratory distress.  Abdominal: Soft. Bowel sounds are normal. She exhibits no distension.  Musculoskeletal: Normal range of motion.  Patient with brace on the back due to her recent back surgery.  Neurological: She is alert and oriented to person, place, and time. No cranial nerve deficit. She exhibits normal muscle tone. Coordination normal.  Skin: Skin is warm. No rash noted.  Nursing note and vitals reviewed.   ED Course  Procedures (including critical care time) Labs Review Labs Reviewed  URINALYSIS, ROUTINE W REFLEX MICROSCOPIC (NOT AT [facility]) - Abnormal; Notable for the following:    Color, Urine AMBER (*)    APPearance CLOUDY (*)    Hgb urine dipstick LARGE (*)    Protein, ur >300 (*)    Leukocytes,  UA SMALL (*)    All other components within normal limits  URINE MICROSCOPIC-ADD ON - Abnormal; Notable for the following:    Squamous Epithelial / LPF 0-5 (*)    Bacteria, UA MANY (*)    All other components within normal limits  URINE CULTURE   Results for orders placed or performed during the hospital encounter of 03/11/15  Urinalysis, Routine w reflex microscopic (not at [facility])  Result Value Ref Range   Color, Urine AMBER (A) YELLOW   APPearance CLOUDY (A) CLEAR   Specific Gravity, Urine 1.023 1.005 - 1.030   pH 6.0 5.0 - 8.0   Glucose, UA NEGATIVE NEGATIVE mg/dL   Hgb urine dipstick LARGE (A) NEGATIVE   Bilirubin Urine NEGATIVE NEGATIVE   Ketones, ur NEGATIVE NEGATIVE mg/dL   Protein, ur >300 (A) NEGATIVE mg/dL   Nitrite NEGATIVE NEGATIVE   Leukocytes, UA SMALL (A) NEGATIVE  Urine microscopic-add on  Result Value Ref Range   Squamous Epithelial / LPF 0-5 (A) NONE SEEN   WBC, UA 6-30 0 - 5 WBC/hpf   RBC / HPF TOO NUMEROUS TO COUNT 0 - 5 RBC/hpf   Bacteria, UA MANY (A) NONE SEEN   Urine-Other LESS THAN 10 mL OF URINE SUBMITTED      Imaging Review No results found. I have personally reviewed and evaluated these images and lab results as part of my medical decision-making.   EKG Interpretation None      MDM   Final diagnoses:  UTI (lower urinary tract infection)   Patient symptoms consistent with urinary tract infection. No complicating factors no fever no nausea or vomiting. Urinalysis consistent with urinary tract infection. Will treat with Keflex. Patient does not have an allergy to cephalosporins. Patient will return for a newer worse symptoms or if not improved in 2 days.     Fredia Sorrow, MD 03/11/15 1201

## 2015-03-11 NOTE — ED Notes (Addendum)
approx 2 weeks ago (02-22-2015) had back surgery by Dr. Sherley Bounds, MD, had rods placed in approx a L1, L2, L3 and L4, also "spacers inserted" per pt statement

## 2015-03-13 LAB — URINE CULTURE: Culture: NO GROWTH

## 2015-04-04 ENCOUNTER — Encounter (HOSPITAL_COMMUNITY): Payer: Self-pay | Admitting: Neurological Surgery

## 2015-06-18 DIAGNOSIS — R002 Palpitations: Secondary | ICD-10-CM | POA: Insufficient documentation

## 2015-06-25 DIAGNOSIS — R232 Flushing: Secondary | ICD-10-CM | POA: Insufficient documentation

## 2015-12-22 IMAGING — RF DG C-ARM GT 120 MIN
1 series · 2 of 2 positions shown · IV contrast (agent unspecified)
Comparison: CT 01/18/2015.

CLINICAL DATA: Lumbar fusion.

EXAM:
DG C-ARM GT 120 MIN; LUMBAR SPINE - 2-3 VIEW
CONTRAST:  None.
FLUOROSCOPY TIME:  Fluoroscopy Time (in minutes and seconds): 3
minutes 24 seconds
Number of Acquired Images:  2

[Series 1: run · 2 of 2 slices shown]
[im 1/2]
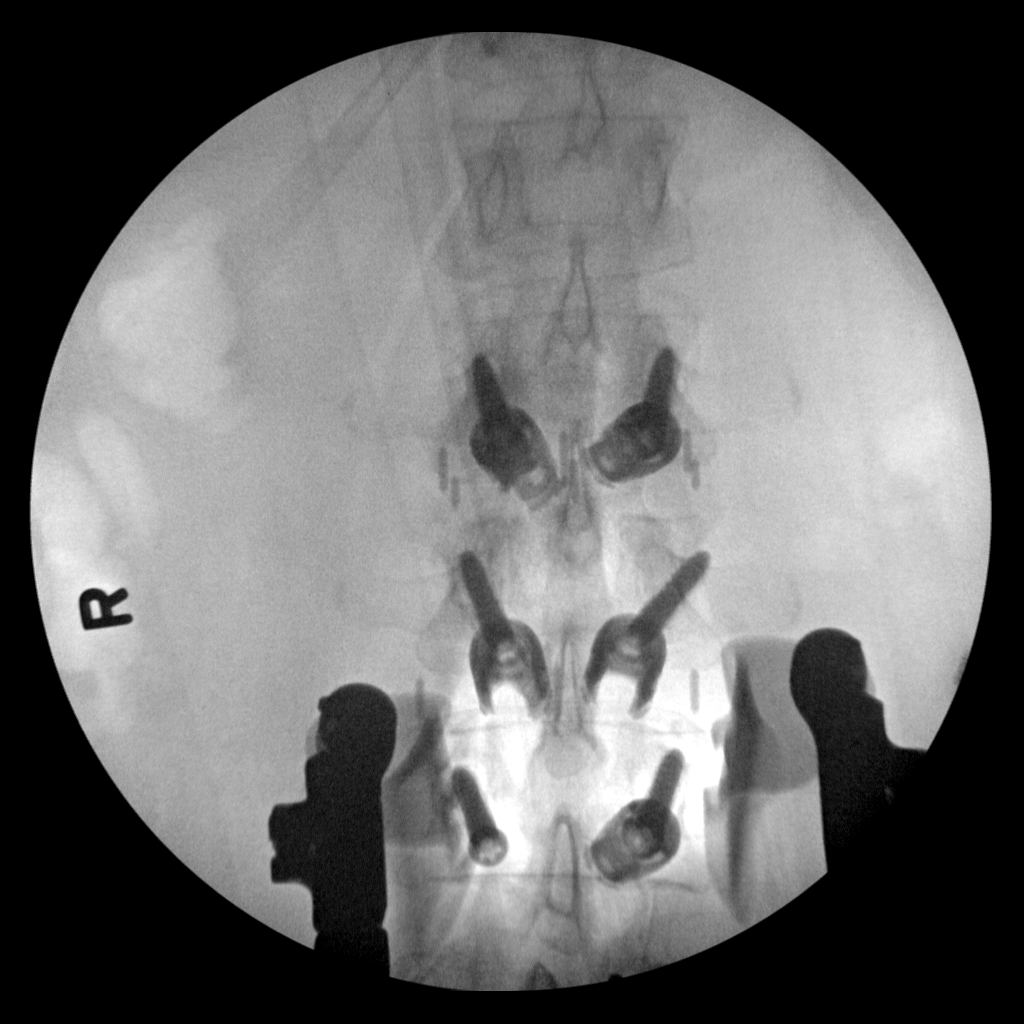
[im 2/2]
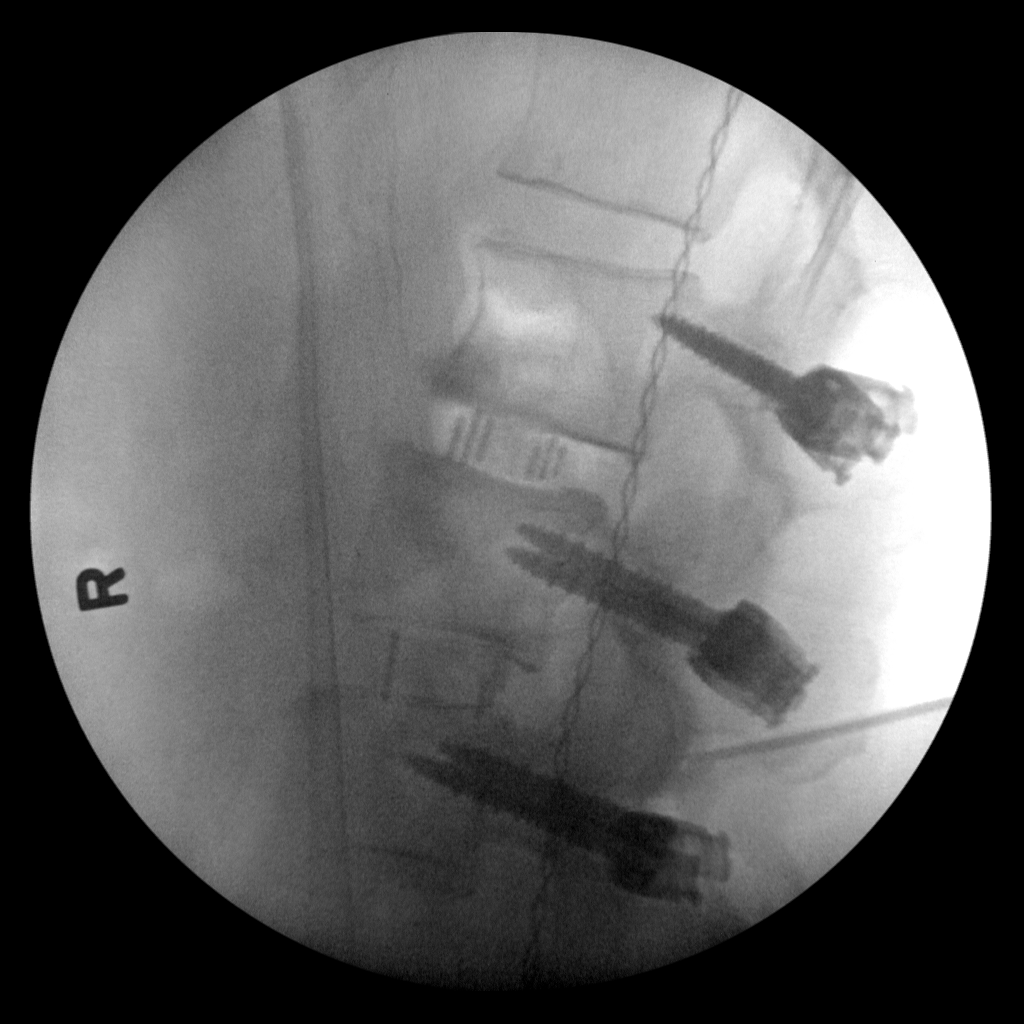

[2 of 2 positions shown; findings below may reference images not displayed]

FINDINGS: Vertebral bodies are difficult to count due to position. Posterior
cortical screws and inter disc fusion devices noted. Good anatomic
alignment.
IMPRESSION: Postsurgical changes lumbar spine.  Good anatomic alignment.

## 2016-03-11 DIAGNOSIS — Z79899 Other long term (current) drug therapy: Secondary | ICD-10-CM | POA: Insufficient documentation

## 2016-03-11 DIAGNOSIS — L659 Nonscarring hair loss, unspecified: Secondary | ICD-10-CM | POA: Insufficient documentation

## 2016-04-17 DIAGNOSIS — R000 Tachycardia, unspecified: Secondary | ICD-10-CM | POA: Insufficient documentation

## 2016-07-16 DIAGNOSIS — E039 Hypothyroidism, unspecified: Secondary | ICD-10-CM | POA: Insufficient documentation

## 2016-07-16 DIAGNOSIS — K219 Gastro-esophageal reflux disease without esophagitis: Secondary | ICD-10-CM | POA: Insufficient documentation

## 2016-12-11 DIAGNOSIS — R8290 Unspecified abnormal findings in urine: Secondary | ICD-10-CM | POA: Insufficient documentation

## 2017-08-05 ENCOUNTER — Other Ambulatory Visit (HOSPITAL_BASED_OUTPATIENT_CLINIC_OR_DEPARTMENT_OTHER): Payer: Self-pay | Admitting: Family Medicine

## 2017-08-05 ENCOUNTER — Ambulatory Visit (HOSPITAL_BASED_OUTPATIENT_CLINIC_OR_DEPARTMENT_OTHER)
Admission: RE | Admit: 2017-08-05 | Discharge: 2017-08-05 | Disposition: A | Discharge status: Home / Self Care | Payer: Medicare Other | Source: Ambulatory Visit | Attending: Family Medicine | Admitting: Family Medicine

## 2017-08-05 DIAGNOSIS — M79661 Pain in right lower leg: Secondary | ICD-10-CM | POA: Diagnosis not present

## 2017-08-05 DIAGNOSIS — M7989 Other specified soft tissue disorders: Secondary | ICD-10-CM | POA: Insufficient documentation

## 2017-08-05 DIAGNOSIS — G4700 Insomnia, unspecified: Secondary | ICD-10-CM | POA: Insufficient documentation

## 2017-09-17 DIAGNOSIS — R739 Hyperglycemia, unspecified: Secondary | ICD-10-CM | POA: Insufficient documentation

## 2017-09-23 DIAGNOSIS — M533 Sacrococcygeal disorders, not elsewhere classified: Secondary | ICD-10-CM | POA: Insufficient documentation

## 2017-09-25 DIAGNOSIS — M25521 Pain in right elbow: Secondary | ICD-10-CM | POA: Insufficient documentation

## 2017-10-26 DIAGNOSIS — Z7901 Long term (current) use of anticoagulants: Secondary | ICD-10-CM | POA: Insufficient documentation

## 2018-04-23 DIAGNOSIS — M51369 Other intervertebral disc degeneration, lumbar region without mention of lumbar back pain or lower extremity pain: Secondary | ICD-10-CM | POA: Insufficient documentation

## 2018-04-23 DIAGNOSIS — M961 Postlaminectomy syndrome, not elsewhere classified: Secondary | ICD-10-CM | POA: Insufficient documentation

## 2018-11-24 DIAGNOSIS — N390 Urinary tract infection, site not specified: Secondary | ICD-10-CM | POA: Insufficient documentation

## 2018-11-24 DIAGNOSIS — N3080 Other cystitis without hematuria: Secondary | ICD-10-CM | POA: Insufficient documentation

## 2019-05-12 DIAGNOSIS — Z8673 Personal history of transient ischemic attack (TIA), and cerebral infarction without residual deficits: Secondary | ICD-10-CM | POA: Insufficient documentation

## 2019-05-12 DIAGNOSIS — I63512 Cerebral infarction due to unspecified occlusion or stenosis of left middle cerebral artery: Secondary | ICD-10-CM | POA: Insufficient documentation

## 2020-01-11 ENCOUNTER — Emergency Department (HOSPITAL_BASED_OUTPATIENT_CLINIC_OR_DEPARTMENT_OTHER): Payer: Medicare Other

## 2020-01-11 ENCOUNTER — Encounter (HOSPITAL_BASED_OUTPATIENT_CLINIC_OR_DEPARTMENT_OTHER): Payer: Self-pay

## 2020-01-11 ENCOUNTER — Other Ambulatory Visit: Payer: Self-pay

## 2020-01-11 ENCOUNTER — Emergency Department (HOSPITAL_COMMUNITY): Payer: Medicare Other

## 2020-01-11 ENCOUNTER — Emergency Department (HOSPITAL_BASED_OUTPATIENT_CLINIC_OR_DEPARTMENT_OTHER)
Admission: EM | Admit: 2020-01-11 | Discharge: 2020-01-11 | Disposition: A | Discharge status: Home / Self Care | Payer: Medicare Other | Attending: Emergency Medicine | Admitting: Emergency Medicine

## 2020-01-11 DIAGNOSIS — N390 Urinary tract infection, site not specified: Secondary | ICD-10-CM | POA: Insufficient documentation

## 2020-01-11 DIAGNOSIS — Z96641 Presence of right artificial hip joint: Secondary | ICD-10-CM | POA: Diagnosis not present

## 2020-01-11 DIAGNOSIS — I10 Essential (primary) hypertension: Secondary | ICD-10-CM | POA: Diagnosis not present

## 2020-01-11 DIAGNOSIS — Z79899 Other long term (current) drug therapy: Secondary | ICD-10-CM | POA: Insufficient documentation

## 2020-01-11 DIAGNOSIS — Z20822 Contact with and (suspected) exposure to covid-19: Secondary | ICD-10-CM | POA: Diagnosis not present

## 2020-01-11 DIAGNOSIS — R519 Headache, unspecified: Secondary | ICD-10-CM | POA: Insufficient documentation

## 2020-01-11 DIAGNOSIS — Z87891 Personal history of nicotine dependence: Secondary | ICD-10-CM | POA: Insufficient documentation

## 2020-01-11 DIAGNOSIS — R202 Paresthesia of skin: Secondary | ICD-10-CM | POA: Insufficient documentation

## 2020-01-11 DIAGNOSIS — R110 Nausea: Secondary | ICD-10-CM | POA: Insufficient documentation

## 2020-01-11 DIAGNOSIS — R200 Anesthesia of skin: Secondary | ICD-10-CM

## 2020-01-11 DIAGNOSIS — Z7901 Long term (current) use of anticoagulants: Secondary | ICD-10-CM | POA: Insufficient documentation

## 2020-01-11 HISTORY — DX: Urinary tract infection, site not specified: N39.0

## 2020-01-11 HISTORY — DX: Cerebral infarction, unspecified: I63.9

## 2020-01-11 HISTORY — DX: Calculus of kidney: N20.0

## 2020-01-11 LAB — URINALYSIS, ROUTINE W REFLEX MICROSCOPIC
Bilirubin Urine: NEGATIVE
Glucose, UA: NEGATIVE mg/dL
Ketones, ur: NEGATIVE mg/dL
Leukocytes,Ua: NEGATIVE
Nitrite: POSITIVE — AB
Protein, ur: NEGATIVE mg/dL
Specific Gravity, Urine: 1.015 (ref 1.005–1.030)
pH: 6.5 (ref 5.0–8.0)

## 2020-01-11 LAB — DIFFERENTIAL
Abs Immature Granulocytes: 0.02 10*3/uL (ref 0.00–0.07)
Basophils Absolute: 0.1 10*3/uL (ref 0.0–0.1)
Basophils Relative: 1 %
Eosinophils Absolute: 0.2 10*3/uL (ref 0.0–0.5)
Eosinophils Relative: 3 %
Immature Granulocytes: 0 %
Lymphocytes Relative: 26 %
Lymphs Abs: 1.7 10*3/uL (ref 0.7–4.0)
Monocytes Absolute: 0.7 10*3/uL (ref 0.1–1.0)
Monocytes Relative: 11 %
Neutro Abs: 3.8 10*3/uL (ref 1.7–7.7)
Neutrophils Relative %: 59 %

## 2020-01-11 LAB — ETHANOL: Alcohol, Ethyl (B): <10 mg/dL (ref <10)

## 2020-01-11 LAB — URINALYSIS, MICROSCOPIC (REFLEX)

## 2020-01-11 LAB — SARS CORONAVIRUS 2 BY RT PCR (HOSPITAL ORDER, PERFORMED IN ~~LOC~~ HOSPITAL LAB): SARS Coronavirus 2: NEGATIVE

## 2020-01-11 LAB — COMPREHENSIVE METABOLIC PANEL
ALT: 23 U/L (ref 0–44)
AST: 32 U/L (ref 15–41)
Albumin: 4.3 g/dL (ref 3.5–5.0)
Alkaline Phosphatase: 102 U/L (ref 38–126)
Anion gap: 8 (ref 5–15)
BUN: 13 mg/dL (ref 8–23)
CO2: 26 mmol/L (ref 22–32)
Calcium: 9.5 mg/dL (ref 8.9–10.3)
Chloride: 106 mmol/L (ref 98–111)
Creatinine, Ser: 0.75 mg/dL (ref 0.44–1.00)
GFR calc Af Amer: >60 mL/min (ref >60)
GFR calc non Af Amer: >60 mL/min (ref >60)
Glucose, Bld: 94 mg/dL (ref 70–99)
Potassium: 3.7 mmol/L (ref 3.5–5.1)
Sodium: 140 mmol/L (ref 135–145)
Total Bilirubin: 0.6 mg/dL (ref 0.3–1.2)
Total Protein: 7.1 g/dL (ref 6.5–8.1)

## 2020-01-11 LAB — PROTIME-INR
INR: 1.2 (ref 0.8–1.2)
Prothrombin Time: 14.4 seconds (ref 11.4–15.2)

## 2020-01-11 LAB — CBC
HCT: 43.7 % (ref 36.0–46.0)
Hemoglobin: 14.6 g/dL (ref 12.0–15.0)
MCH: 32.2 pg (ref 26.0–34.0)
MCHC: 33.4 g/dL (ref 30.0–36.0)
MCV: 96.3 fL (ref 80.0–100.0)
Platelets: 198 10*3/uL (ref 150–400)
RBC: 4.54 MIL/uL (ref 3.87–5.11)
RDW: 12.3 % (ref 11.5–15.5)
WBC: 6.6 10*3/uL (ref 4.0–10.5)
nRBC: 0 % (ref 0.0–0.2)

## 2020-01-11 LAB — APTT: aPTT: 38 seconds — ABNORMAL HIGH (ref 24–36)

## 2020-01-11 LAB — RAPID URINE DRUG SCREEN, HOSP PERFORMED
Amphetamines: NOT DETECTED
Barbiturates: NOT DETECTED
Benzodiazepines: NOT DETECTED
Cocaine: NOT DETECTED
Opiates: NOT DETECTED
Tetrahydrocannabinol: NOT DETECTED

## 2020-01-11 MED ORDER — ACETAMINOPHEN 500 MG PO TABS
1000.0000 mg | ORAL_TABLET | Freq: Once | ORAL | Status: DC
  Filled 2020-01-11: qty 2

## 2020-01-11 MED ORDER — FOSFOMYCIN TROMETHAMINE 3 G PO PACK
3.0000 g | PACK | Freq: Once | ORAL | Status: AC
  Administered 2020-01-11: 3 g via ORAL
  Filled 2020-01-11: qty 3

## 2020-01-11 MED ORDER — SODIUM CHLORIDE 0.9 % IV BOLUS
500.0000 mL | Freq: Once | INTRAVENOUS | Status: AC
  Administered 2020-01-11: 500 mL via INTRAVENOUS

## 2020-01-11 MED ORDER — DIPHENHYDRAMINE HCL 50 MG/ML IJ SOLN
25.0000 mg | Freq: Once | INTRAMUSCULAR | Status: AC
  Administered 2020-01-11: 25 mg via INTRAVENOUS
  Filled 2020-01-11: qty 1

## 2020-01-11 MED ORDER — METOCLOPRAMIDE HCL 5 MG/ML IJ SOLN
5.0000 mg | Freq: Once | INTRAMUSCULAR | Status: AC
  Administered 2020-01-11: 5 mg via INTRAVENOUS
  Filled 2020-01-11: qty 2

## 2020-01-11 NOTE — ED Notes (Signed)
Has c/o migraine HA with seeing Halo's, pain on left side of neck to left shoulder. Soreness in face. Onset was Sunday PM, felt "washed out" on Sunday, having aches

## 2020-01-11 NOTE — ED Notes (Signed)
Awaiting for EDP evaluation

## 2020-01-11 NOTE — ED Provider Notes (Signed)
Patient seen on arrival from our affiliated facility.  Here she is awake, alert, in no distress, but continues to complain of headache. Tylenol provided, MRI pending.  On return from MRI the patient continues to complain of headache.  I reviewed the images, interpretation, no stroke, mass.  There are age-appropriate degenerative findings.  She continues to complain of headache.  7:54 PM Patient's had pain now 3/4 out of 10, reduced from arrival.  She has received fluids, Reglan, Benadryl.  Urine culture has been ordered. We have discussed the patient's abnormal urinalysis, she notes that over the past year she has been battling inconsistent dysuria, with abnormal urinalysis, and has had at least one positive culture with E. coli. She not currently taking any antibiotics. She is scheduled to follow-up with urology in 2 weeks. She now notes that she has some mild burning, opaque urine, but no fever or other frank dysuria. Patient amenable to course of fosfomycin, urine culture sent.  With improvement of headache here, unremarkable MRI, negative Covid test, the patient is appropriate for, amenable to discharge with outpatient follow-up.   Carmin Muskrat, MD 01/11/20 [name]

## 2020-01-11 NOTE — ED Notes (Signed)
BEFAST / VAN BEDSIDE ASSESSMENT NEGATIVE

## 2020-01-11 NOTE — ED Provider Notes (Signed)
[facility] Provider Note   CSN: [id number] Arrival date & time: 01/11/20  1221     History Chief Complaint  Patient presents with  . Headache    Connie Barnes is a 74 y.o. female.  HPI      Monday Tuesday headache Saturday, Sunday, aching around eye, beneath eye, severe lower ache, was not associated with anything, started spontaneously.  Monday, Tuesday began to have severe intense headache, fullness, worse with bright lights, spot in middle of vision and halo around it--hx of migraines with similar symptoms in the past but has not had one for a long time Numbness to left side of face and across top of lip Fulness pressure nose up and side of temples Aching down neck to shoulder, has it chronically but more noticeable now Nausea, no vomiting   Denies numbness with exception of facial, weakness, difficulty talking or walking, visual changes or facial droop.    No sick contacts Hx of vaccination  Headache now is 4-5/10, took tylenol has been as high as an 8   Past Medical History:  Diagnosis Date  . Anemia    hx child  . Arthritis   . Atrial fibrillation ([place])   . Complication of anesthesia    long time to get over anesthesia, very belligerant,very small veins per pt  . Dysrhythmia    atrial fib episode after lithotripsy 2010. saw dr Johnanna Schneiders cardiology no visit since  . Family history of adverse reaction to anesthesia    belligerant sister  . Headache   . History of kidney stones    have numerous stones at present  . Hypertension   . Kidney stone   . Pneumonia    hx  . PONV (postoperative nausea and vomiting)   . Stroke ([place])   . UTI (urinary tract infection)     Patient Active Problem List   Diagnosis Date Noted  . S/P lumbar spinal fusion 02/22/2015  . LBP (low back pain) 10/20/2014  . Calculi, ureter 08/08/2014  . Osteopenia 01/03/2014  . Acute infection of nasal sinus 10/07/2013  . Acute upper  respiratory infection 10/07/2013  . Allergic rhinitis 10/07/2013  . Extremity pain 10/07/2013  . A-fib ([place]) 10/07/2013  . Atrial flutter ([place]) 10/07/2013  . Benign essential HTN 10/07/2013  . Benign neoplasm of colon 10/07/2013  . Blood in feces 10/07/2013  . Bursitis 10/07/2013  . Calculus of kidney 10/07/2013  . Cerumen impaction 10/07/2013  . CD (contact dermatitis) 10/07/2013  . Diverticulitis of colon 10/07/2013  . Difficult or painful urination 10/07/2013  . Atheroma, skin 10/07/2013  . Febrile 10/07/2013  . Foot pain 10/07/2013  . Frank hematuria 10/07/2013  . Difficulty hearing 10/07/2013  . 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 Decreased potassium in the blood 10/07/2013  . Arthralgia of lower leg 10/07/2013  . Elevated WBC count 10/07/2013  . Flu vaccine need 10/07/2013  . Disturbance of skin sensation 10/07/2013  . Dupuytren's contracture of foot 10/07/2013  . Hemorrhoids, internal 10/07/2013  . Pus in urine 10/07/2013  . Menopausal symptom 10/07/2013  . Avitaminosis D 10/07/2013    Past Surgical History:  Procedure Laterality Date  . ABDOMINAL HYSTERECTOMY    . ANTERIOR LAT LUMBAR FUSION Right 02/22/2015   Procedure: RIGHT Extreme Lateral Interbody Fusion Lumbar one-two, Lumbar two-three;  Surgeon: Eustace Moore, MD;  Location: [facility];  Service: Neurosurgery;  Laterality: Right;  . BLADDER SUSPENSION  12  . DILATION AND CURETTAGE OF  UTERUS    . EXCISION MORTON'S NEUROMA Left    foot  . JOINT REPLACEMENT  07   rt hip   . LAMINECTOMY WITH POSTERIOR LATERAL ARTHRODESIS LEVEL 2  02/22/2015   Procedure: Hemilaminectomy Lumbar two-three Right, Posterior Fusion Lumbar one-two, two-three, Segmental fixation Lumbar one-three ;  Surgeon: Eustace Moore, MD;  Location: [facility];  Service: Neurosurgery;;  . LIPOMA EXCISION     back   . LITHOTRIPSY    . SHOULDER ARTHROSCOPY Right 14  . TONSILLECTOMY     74 yrs old     OB History   No obstetric history  on file.     No family history on file.  Social History   Tobacco Use  . Smoking status: Former Smoker    Packs/day: 0.50    Years: 7.00    Pack years: 3.50    Types: Cigarettes    Quit date: 02/14/2003    Years since quitting: 16.9  . Smokeless tobacco: Never Used  Vaping Use  . Vaping Use: Never used  Substance Use Topics  . Alcohol use: Yes    Comment: daily  . Drug use: No    Home Medications Prior to Admission medications   Medication Sig Start Date End Date Taking? Authorizing Provider  diltiazem (CARDIZEM) 120 MG tablet Take 120 mg by mouth 4 (four) times daily.   Yes [provider]  flecainide (TAMBOCOR) 50 MG tablet Take 50 mg by mouth 2 (two) times daily.   Yes [provider]  levothyroxine (SYNTHROID) 25 MCG tablet Take 25 mcg by mouth daily before breakfast.   Yes [provider]  rivaroxaban (XARELTO) 20 MG TABS tablet Take 25 mg by mouth daily with supper.   Yes [provider]  acetaminophen (TYLENOL) 500 MG tablet Take 1,000 mg by mouth every 6 (six) hours as needed. pain     [provider]  acyclovir (ZOVIRAX) 800 MG tablet Take 800 mg by mouth daily.      [provider]  aspirin 81 MG EC tablet Take 81 mg by mouth daily.      [provider]  betamethasone dipropionate (DIPROLENE) 0.05 % cream Apply topically as needed.    [provider]  cephALEXin (KEFLEX) 500 MG capsule Take 1 capsule (500 mg total) by mouth 4 (four) times daily. 03/11/15   Fredia Sorrow, MD  cetirizine (ZYRTEC) 10 MG tablet Take 10 mg by mouth daily.      [provider]  Cholecalciferol (VITAMIN D) 1000 UNITS capsule Take 2,000 Units by mouth daily.     [provider]  clindamycin (CLEOCIN) 300 MG capsule Take 300 mg by mouth See admin instructions. 300mg  prior to dental procedures    [provider]  clobetasol cream (TEMOVATE) 0.97 % Apply 1 application topically as needed.     [provider]  estradiol (ESTRACE) 0.5 MG tablet Take 0.5 mg by mouth daily.     [provider]  gabapentin (NEURONTIN) 100 MG capsule TAKE 1 CAPSULE BY MOUTH AT BEDTIME AND INCREASE TO 3 CAPSULES AS DIRECTED 05/25/14   [provider]  ibuprofen (ADVIL,MOTRIN) 200 MG tablet Take 800 mg by mouth every 6 (six) hours as needed. pain     [provider]  loratadine (CLARITIN) 10 MG tablet Take 10 mg by mouth daily as needed for allergies.     [provider]  losartan (COZAAR) 25 MG tablet TAKE 1 TABLET BY MOUTH DAILY 11/30/14  [provider]  methocarbamol (ROBAXIN) 500 MG tablet Take 1 tablet (500 mg total) by mouth every 6 (six) hours as needed for muscle spasms. 02/25/15   Ditty, Kevan Ny, MD  multivitamin-iron-minerals-folic acid (CENTRUM) chewable tablet Chew 1 tablet by mouth daily.     [provider]  nabumetone (RELAFEN) 500 MG tablet take 1 tablet by mouth daily as needed for muscle spasms    [provider]  Olopatadine HCl (PATADAY) 0.2 % SOLN Place 1 drop into both eyes 2 (two) times daily as needed (dry eyes). Seasonal allergies    [provider]  oxyCODONE-acetaminophen (PERCOCET/ROXICET) 5-325 MG tablet Take 1-2 tablets by mouth every 4 (four) hours as needed for moderate pain. 02/25/15   Ditty, Kevan Ny, MD  Polyethyl Glycol-Propyl Glycol (SYSTANE OP) Place 1 drop into both eyes 2 (two) times daily as needed. Dry eyes     [provider]  simvastatin (ZOCOR) 10 MG tablet Take 10 mg by mouth daily at 6 PM.  11/02/14 11/02/15  [provider]  tiZANidine (ZANAFLEX) 4 MG tablet Take 4 mg by mouth every 8 (eight) hours as needed for muscle spasms.     [provider]  triamcinolone cream (KENALOG) 0.1 % Apply 1 application topically daily as needed (irritation).     [provider]  triamterene-hydrochlorothiazide (DYAZIDE) 37.5-25 MG per capsule Take 1 capsule by  mouth every morning.      [provider]    Allergies    Ciprofloxacin hcl, Levofloxacin, Morphine and related, Talwin [pentazocine], Amiodarone, Gentamycin [gentamicin], Hydrocodone, Trazodone and nefazodone, Amoxicillin, Tetracyclines & related, and Zanaflex [tizanidine hcl]  Review of Systems   Review of Systems  Constitutional: Negative for fever.  HENT: Negative for sore throat.   Eyes: Negative for visual disturbance.  Respiratory: Positive for cough. Negative for shortness of breath.   Cardiovascular: Negative for chest pain.  Gastrointestinal: Positive for nausea. Negative for abdominal pain.  Genitourinary: Negative for difficulty urinating.  Musculoskeletal: Negative for back pain and neck pain.  Skin: Negative for rash.  Neurological: Positive for headaches. Negative for syncope.    Physical Exam Updated Vital Signs BP (!) 160/80 (BP Location: Left Arm)   Pulse (!) 56   Temp 97.8 F (36.6 C) (Oral)   Resp 16   Ht 5\' 4"  (1.626 m)   Wt 74.6 kg   SpO2 97%   BMI 28.24 kg/m   Physical Exam Vitals and nursing note reviewed.  Constitutional:      General: She is not in acute distress.    Appearance: She is well-developed. She is not diaphoretic.  HENT:     Head: Normocephalic and atraumatic.  Eyes:     General: No visual field deficit.    Conjunctiva/sclera: Conjunctivae normal.  Cardiovascular:     Rate and Rhythm: Normal rate and regular rhythm.     Heart sounds: Normal heart sounds. No murmur heard.  No friction rub. No gallop.   Pulmonary:     Effort: Pulmonary effort is normal. No respiratory distress.     Breath sounds: Normal breath sounds. No wheezing or rales.  Abdominal:     General: There is no distension.     Palpations: Abdomen is soft.     Tenderness: There is no abdominal tenderness. There is no guarding.  Musculoskeletal:        General: No tenderness.     Cervical back: Normal range of motion.  Skin:    General: Skin is warm  and  dry.     Findings: No erythema or rash.  Neurological:     Mental Status: She is alert and oriented to person, place, and time.     GCS: GCS eye subscore is 4. GCS verbal subscore is 5. GCS motor subscore is 6.     Cranial Nerves: No cranial nerve deficit, dysarthria or facial asymmetry.     Sensory: Sensory deficit (left side of face) present.     Motor: No weakness.     Coordination: Romberg sign negative. Coordination normal.     Gait: Gait normal.     ED Results / Procedures / Treatments   Labs (all labs ordered are listed, but only abnormal results are displayed) Labs Reviewed  APTT - Abnormal; Notable for the following components:      Result Value   aPTT 38 (*)    All other components within normal limits  URINALYSIS, ROUTINE W REFLEX MICROSCOPIC - Abnormal; Notable for the following components:   Hgb urine dipstick SMALL (*)    Nitrite POSITIVE (*)    All other components within normal limits  URINALYSIS, MICROSCOPIC (REFLEX) - Abnormal; Notable for the following components:   Bacteria, UA MANY (*)    All other components within normal limits  SARS CORONAVIRUS 2 BY RT PCR (HOSPITAL ORDER, [facility])  URINE CULTURE  ETHANOL  PROTIME-INR  CBC  DIFFERENTIAL  COMPREHENSIVE METABOLIC PANEL  RAPID URINE DRUG SCREEN, HOSP PERFORMED    EKG EKG Interpretation  Date/Time:  Wednesday January 11 2020 14:06:07 EDT Ventricular Rate:  56 PR Interval:    QRS Duration: 104 QT Interval:  484 QTC Calculation: 468 R Axis:   49 Text Interpretation: Sinus rhythm No significant change since last tracing Confirmed by Gareth Morgan [phone]) on 01/11/2020 9:00:37 PM   Radiology CT Head Wo Contrast  Result Date: 01/11/2020 CLINICAL DATA:  Headache, new worsening. Left-sided facial numbness. Vision changes. EXAM: CT HEAD WITHOUT CONTRAST TECHNIQUE: Contiguous axial images were obtained from the base of the skull through the vertex without intravenous  contrast. COMPARISON:  None. FINDINGS: Brain: No evidence of acute large vascular territory infarction, hemorrhage, hydrocephalus, extra-axial collection or mass lesion/mass effect. Mild diffuse cerebral volume loss. Vascular: Calcific atherosclerosis. Skull: Normal. Negative for fracture or focal lesion. Sinuses/Orbits: No acute abnormality. Other: No mastoid effusions. IMPRESSION: No evidence of acute intracranial abnormality. Electronically Signed   By: Margaretha Sheffield MD   On: 01/11/2020 13:02   MR BRAIN WO CONTRAST  Result Date: 01/11/2020 CLINICAL DATA:  Stroke follow-up.  Migraine headache vision change. EXAM: MRI HEAD WITHOUT CONTRAST TECHNIQUE: Multiplanar, multiecho pulse sequences of the brain and surrounding structures were obtained without intravenous contrast. COMPARISON:  MRI head 04/22/2019 FINDINGS: Brain: Negative for acute infarct Ventricle size and cerebral volume normal. Mild white matter changes stable from the prior study. Brainstem and cerebellum normal. Negative for hemorrhage or mass. Vascular: Normal arterial flow voids Skull and upper cervical spine: No focal skeletal lesion. Sinuses/Orbits: Mucosal edema paranasal sinuses with retention cyst right maxillary sinus. Bilateral cataract extraction Other: None IMPRESSION: Negative for acute infarct. Mild white matter changes compatible with chronic microvascular ischemia, less than expected for age. Electronically Signed   By: Franchot Gallo M.D.   On: 01/11/2020 17:42    Procedures Procedures (including critical care time)  Medications Ordered in ED Medications  acetaminophen (TYLENOL) tablet 1,000 mg (1,000 mg Oral Refused 01/11/20 2013)  sodium chloride 0.9 % bolus 500 mL (0 mLs  Intravenous Stopped 01/11/20 2014)  metoCLOPramide (REGLAN) injection 5 mg (5 mg Intravenous Given 01/11/20 1901)  diphenhydrAMINE (BENADRYL) injection 25 mg (25 mg Intravenous Given 01/11/20 1901)  fosfomycin (MONUROL) packet 3 g (3 g Oral Given  01/11/20 2054)    ED Course  I have reviewed the triage vital signs and the nursing notes.  Pertinent labs & imaging results that were available during my care of the patient were reviewed by me and considered in my medical decision making (see chart for details).    MDM Rules/Calculators/A&P                          74yo female with history of atrial fibrillation on xarelto, CVA, htn, present with concern for headache and left facial numbness.  DDx includes ICH, CVA, complicated migraine. Normal pupils, doubt glaucoma. No acute lab abnormalities. CT head without acute findings.  Hx of migraines in the past with some typical symptoms but given risk factors and hx of CVA, will transfer for further evaluation with MRI for possible stroke.    Final Clinical Impression(s) / ED Diagnoses Final diagnoses:  Bad headache  Lower urinary tract infectious disease  Numbness    Rx / DC Orders ED Discharge Orders    None       Gareth Morgan, MD 01/11/20 2113

## 2020-01-11 NOTE — ED Triage Notes (Signed)
Pt c/o "migraine" with left side facial numbness x 3 days-states she was sent by PCP-NAD-steady gait

## 2020-01-11 NOTE — Discharge Instructions (Addendum)
As discussed, your evaluation today has been largely reassuring.  But, it is important that you monitor your condition carefully, and do not hesitate to return to the ED if you develop new, or concerning changes in your condition.  Your coronavirus test was negative, and your MRI did not demonstrate evidence for a new stroke or mass.  Please follow-up with your physician for appropriate ongoing care.

## 2020-11-09 IMAGING — MR MR HEAD W/O CM
7 of 11 series · 25 of 48 positions shown · non-contrast
Comparison: MRI head 04/22/2019

CLINICAL DATA: Stroke follow-up.  Migraine headache vision change.

EXAM:
MRI HEAD WITHOUT CONTRAST
TECHNIQUE: Multiplanar, multiecho pulse sequences of the brain and surrounding
structures were obtained without intravenous contrast.

[Series 2: DWI · axial · 3.0mm · 0.94mm/px · z∈[-71,+85]mm · 7 of 106 slices shown (1 of 2)]
[im 1/106]
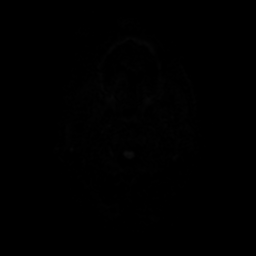
[im 18/106]
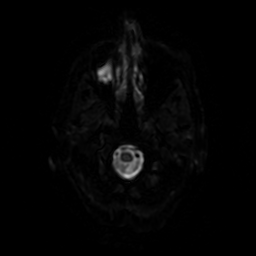
[im 36/106]
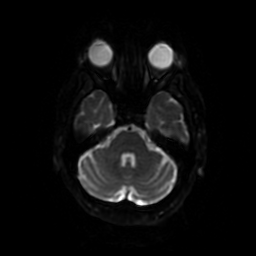
[im 53/106]
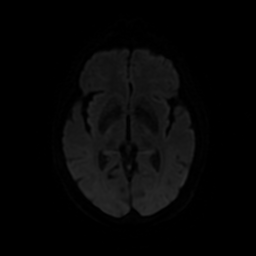
[im 71/106]
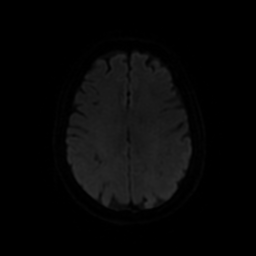
[im 88/106]
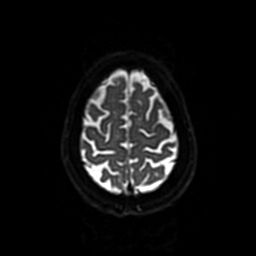
[im 106/106]
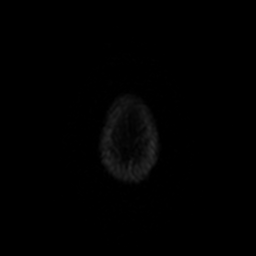

[Series 3: DWI · coronal · 4.0mm · 0.94mm/px · 6 of 74 slices shown (2 of 2)]
[im 1/74]
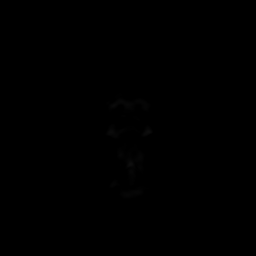
[im 15/74]
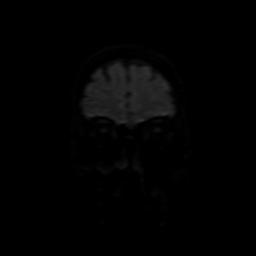
[im 30/74]
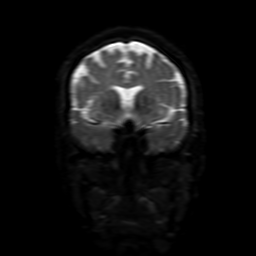
[im 44/74]
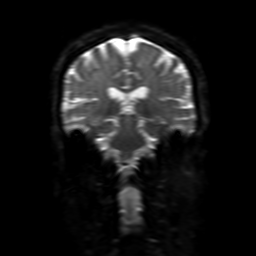
[im 59/74]
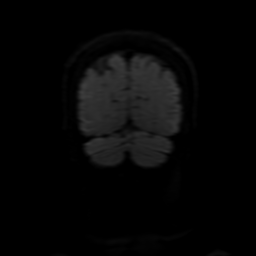
[im 74/74]
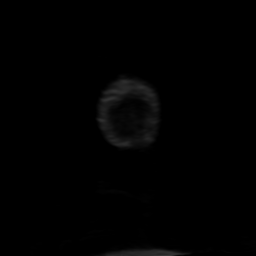

[Series 4: FLAIR · sagittal · 5.0mm · 0.23mm/px · 2 of 22 slices shown (1 of 2)]
[im 1/22]
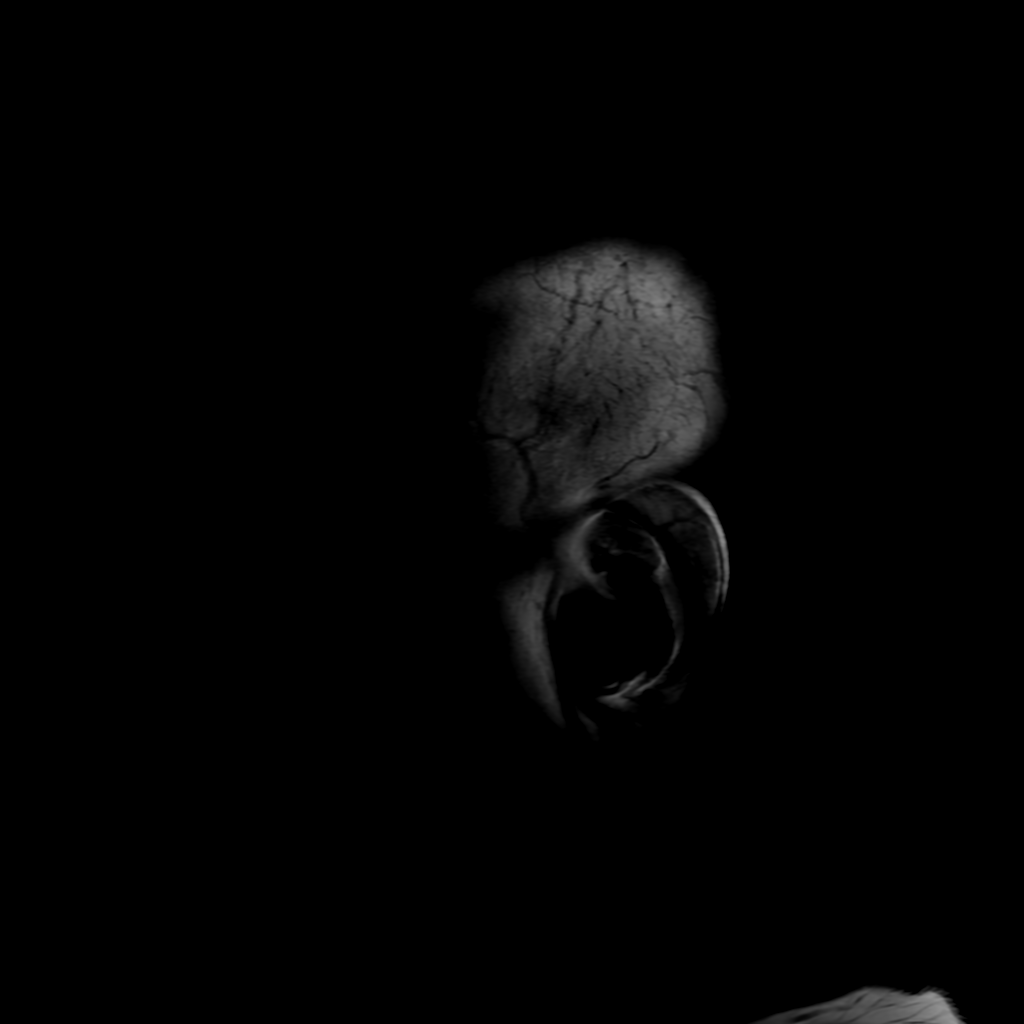
[im 22/22]
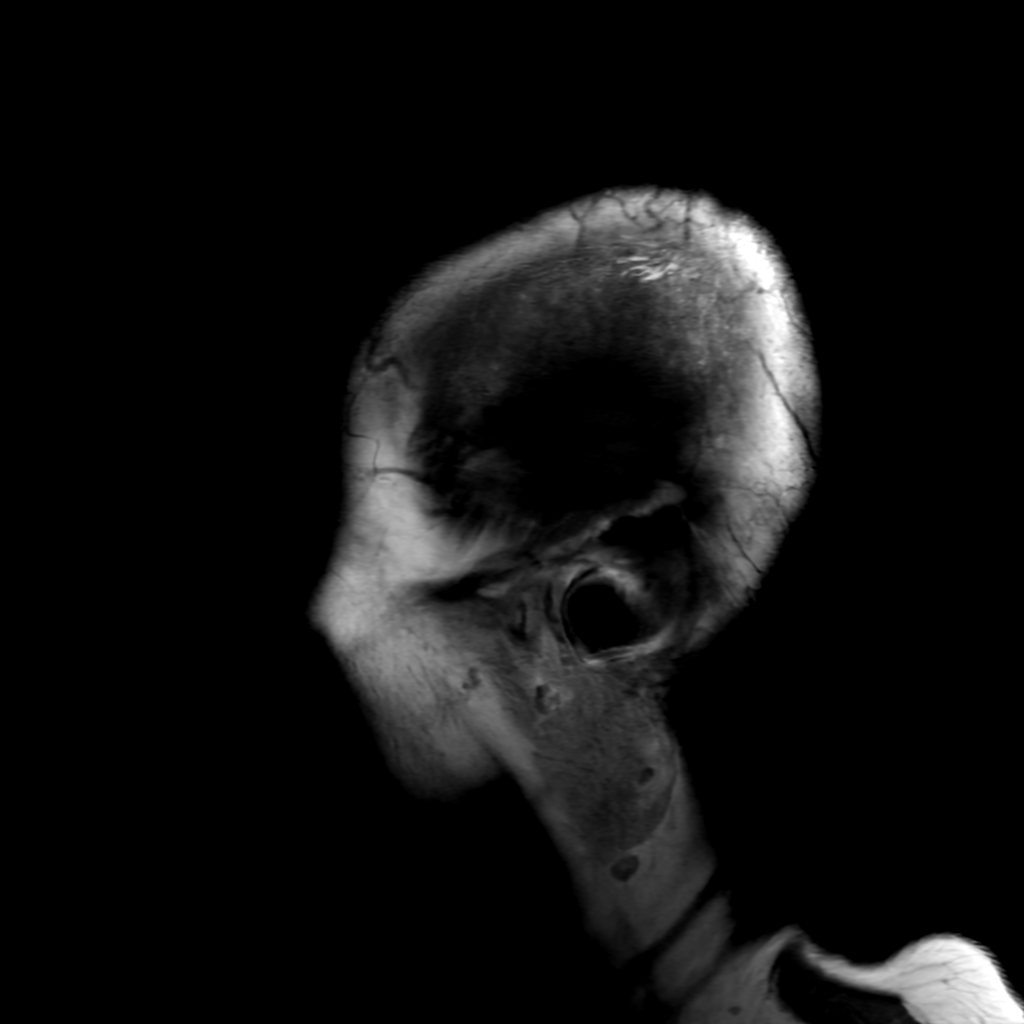

[Series 5: T2 · axial · 5.0mm · 0.23mm/px · 1 of 26 slices shown]
[im 1/26]
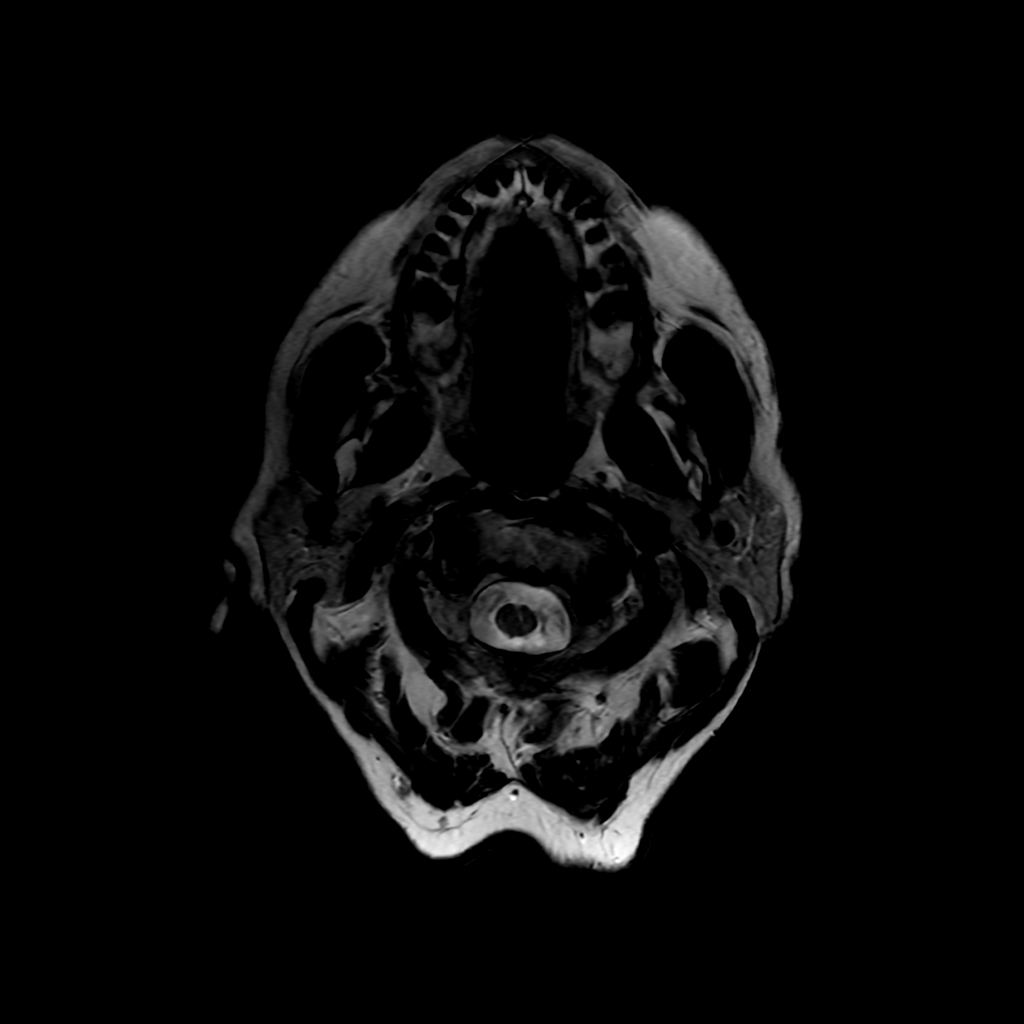

[Series 6: FLAIR · axial · 3.0mm · 0.47mm/px · z∈[-71,+79]mm · 2 of 26 slices shown (2 of 2)]
[im 1/26]
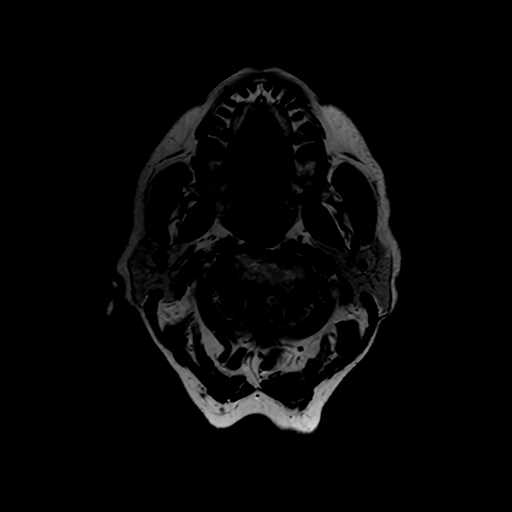
[im 26/26]
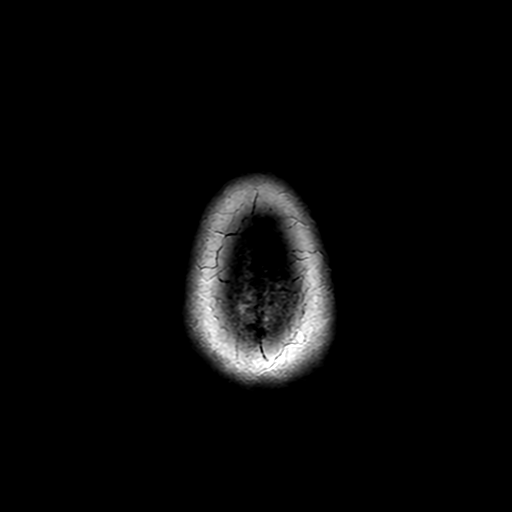

[Series 250: ADC · axial · 3.0mm · 0.94mm/px · z∈[-71,+85]mm · 4 of 53 slices shown (1 of 2)]
[im 1/53]
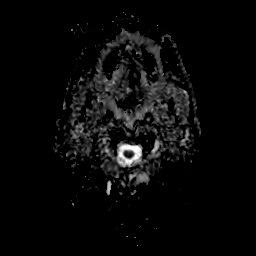
[im 18/53]
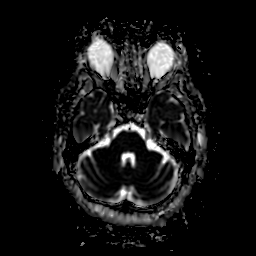
[im 35/53]
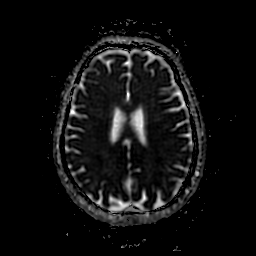
[im 53/53]
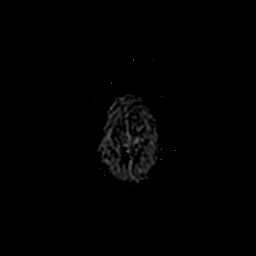

[Series 350: ADC · coronal · 4.0mm · 0.94mm/px · 3 of 37 slices shown (2 of 2)]
[im 1/37]
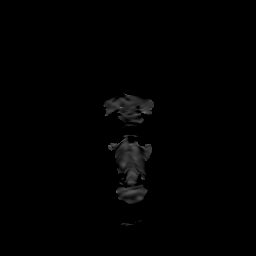
[im 19/37]
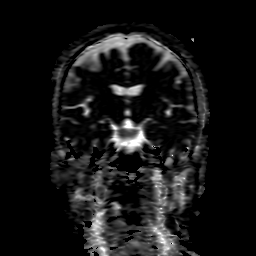
[im 37/37]
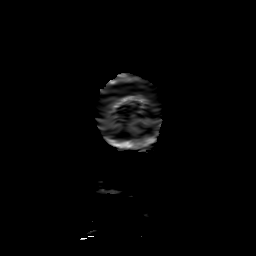

[25 of 48 positions shown; findings below may reference images not displayed]

FINDINGS: Brain: Negative for acute infarct

Ventricle size and cerebral volume normal. Mild white matter changes
stable from the prior study. Brainstem and cerebellum normal.
Negative for hemorrhage or mass.

Vascular: Normal arterial flow voids

Skull and upper cervical spine: No focal skeletal lesion.

Sinuses/Orbits: Mucosal edema paranasal sinuses with retention cyst
right maxillary sinus. Bilateral cataract extraction

Other: None
IMPRESSION: Negative for acute infarct.

Mild white matter changes compatible with chronic microvascular
ischemia, less than expected for age.

## 2020-11-14 DIAGNOSIS — Z87442 Personal history of urinary calculi: Secondary | ICD-10-CM | POA: Insufficient documentation

## 2020-11-17 DIAGNOSIS — D6869 Other thrombophilia: Secondary | ICD-10-CM | POA: Insufficient documentation

## 2021-10-11 DIAGNOSIS — R9439 Abnormal result of other cardiovascular function study: Secondary | ICD-10-CM | POA: Insufficient documentation

## 2022-01-14 DIAGNOSIS — M791 Myalgia, unspecified site: Secondary | ICD-10-CM | POA: Insufficient documentation

## 2022-01-14 DIAGNOSIS — S39012A Strain of muscle, fascia and tendon of lower back, initial encounter: Secondary | ICD-10-CM | POA: Insufficient documentation

## 2022-01-14 DIAGNOSIS — M7918 Myalgia, other site: Secondary | ICD-10-CM | POA: Insufficient documentation

## 2022-05-23 DIAGNOSIS — E1165 Type 2 diabetes mellitus with hyperglycemia: Secondary | ICD-10-CM | POA: Insufficient documentation

## 2022-05-23 DIAGNOSIS — G4733 Obstructive sleep apnea (adult) (pediatric): Secondary | ICD-10-CM | POA: Insufficient documentation

## 2022-05-28 DIAGNOSIS — J343 Hypertrophy of nasal turbinates: Secondary | ICD-10-CM | POA: Insufficient documentation

## 2023-03-13 LAB — HM DIABETES EYE EXAM

## 2023-08-14 ENCOUNTER — Encounter: Payer: Self-pay | Admitting: Family Medicine

## 2023-08-14 ENCOUNTER — Ambulatory Visit (INDEPENDENT_AMBULATORY_CARE_PROVIDER_SITE_OTHER): Admitting: Family Medicine

## 2023-08-14 VITALS — BP 122/78 | HR 63 | Temp 98.5°F | Ht 64.02 in | Wt 160.0 lb

## 2023-08-14 DIAGNOSIS — I10 Essential (primary) hypertension: Secondary | ICD-10-CM | POA: Diagnosis not present

## 2023-08-14 DIAGNOSIS — M25551 Pain in right hip: Secondary | ICD-10-CM

## 2023-08-14 DIAGNOSIS — Z23 Encounter for immunization: Secondary | ICD-10-CM

## 2023-08-14 DIAGNOSIS — E039 Hypothyroidism, unspecified: Secondary | ICD-10-CM | POA: Diagnosis not present

## 2023-08-14 DIAGNOSIS — Z79899 Other long term (current) drug therapy: Secondary | ICD-10-CM

## 2023-08-14 DIAGNOSIS — R202 Paresthesia of skin: Secondary | ICD-10-CM

## 2023-08-14 DIAGNOSIS — E785 Hyperlipidemia, unspecified: Secondary | ICD-10-CM

## 2023-08-14 DIAGNOSIS — E1165 Type 2 diabetes mellitus with hyperglycemia: Secondary | ICD-10-CM | POA: Diagnosis not present

## 2023-08-14 MED ORDER — GABAPENTIN 600 MG PO TABS: 600.0000 mg | ORAL_TABLET | Freq: Every day | ORAL | 0 refills | Status: AC

## 2023-08-14 NOTE — Progress Notes (Signed)
 Patient Office Visit  Assessment & Plan:  Benign essential HTN -     COMPLETE METABOLIC PANEL WITHOUT GFR -     CBC with Differential/Platelet  Acquired hypothyroidism -     TSH  Right hip pain  Type 2 diabetes mellitus with hyperglycemia, without long-term current use of insulin (HCC) -     Hemoglobin A1c  Taking a statin medication -     COMPLETE METABOLIC PANEL WITHOUT GFR  Need for Tdap vaccination -     Tdap vaccine greater than or equal to 80yo IM  Tingling of both feet -     Gabapentin ; Take 1 tablet (600 mg total) by mouth at bedtime.  Dispense: 30 tablet; Refill: 0  Hyperlipidemia, unspecified hyperlipidemia type -     Lipid panel   Test results were reviewed and analyzed as part of the medical decision making of this visit.  Reviewed previous medical records from [facility], cardiology and urology during the office visit.  Reviewed previous labs done in December. Patient will use over-the-counter Voltaren gel over the right hip.  Patient is aware she can take over-the-counter Tylenol  until she can see her orthopedics at [facility].  Follow-up on lab work and notify patient. Tdap updated today.  Recommend healthy diet i.e mediterranean/DASH diet, consistent exercise - 30 minutes 5 day per week, and gradual weight loss. Patient will restart gabapentin  (tablets) and see if she can cut the tablet in quarters 150 mg and if that is ineffective she will go to 300 mg nightly.  Her insurance does not want to cover 100mg  capsules (pt aware). Return in 6 months or sooner if necessary. Return in about 6 months (around 02/13/2024), or if symptoms worsen or fail to improve.   Subjective:    Patient ID: Connie Barnes, female    DOB: 31-Jul-1945  Age: 78 y.o. MRN: [id number]  No chief complaint on file.   HPI Establish primary care and discuss chronic medical issues.  HTN-using antihypertensive medication without difficulty.  Denies associated signs and symptoms  including chest pain, shortness of breath, cough headache, peripheral swelling cramps spasms and palpitations.  Voices understanding of the potential for interference with blood pressure control with substances including high sodium intake, decongestions, herbal supplements weight loss supplements nutritional supplements.  Blood pressures at home are less than 140/90.   Hyperlipidemia-denies unusual muscle aches or muscle cramps or difficulty tolerating statin therapy.  Aware of need for diet control, exercise and healthy eating.  Patient is aware not to consume grapefruit juice or pomegranate juice. Type 2 diabetes-previously controlled.  Last A1c was 6.6.  Fasting blood sugars are 130s to 140 range.  Patient has been eating healthier overall.  Denies diarrhea, peripheral swelling, hypoglycemia, excessive thirst, excessive urination, visual fluctuation, and fatigue.  Making an effort on diet control and exercise.  Has an up-to-date dilated eye exam.  Pt having tingling of both of feet which bothers her at night time.  Patient's weight has reduced some.  Patient does eat healthy for the most part and stays active.  Was not able to be too active when she had a kidney stone for couple weeks but now she is back on track. Hypothyroidism-patient takes levothyroxine 1-1/2 tablets 4 days a week and 1 tablet three days per week.  Kidney stones- pt sees Dr. Doyne Genin re this. Pt had kidney stone in March but did not need to have a procedure.  Paroxysmal A-fib-patient sees cardiology in [facility] regarding this.  Patient continues on Xarelto and Tambocor. Reviewed cardiology notes during Office visit.  1. PAF (paroxysmal atrial fibrillation) (CMD) (Primary) -She is in normal sinus rhythm today -Doing well on flecainide 100 mg twice daily -On 180 mg daily of diltiazem for breakthrough rate control -Overall no issues with her heart rate. I did instruct her if she needs stenting or some other procedure for her kidney  stone that it is low risk for her to hold her Xarelto 72 hours prior. - ECG 12 lead  Right hip pain-patient does see Dr. Hezzie Loupe at [facility].  Patient will need to call them regarding ongoing hip pain.  Patient knows that she cannot take NSAIDs.  Patient has not tried Voltaren gel.  Patient does take Tylenol  which does help her. Health maintenance-patient is up-to-date on mammogram colonoscopies.  Patient will need tetanus updated today.  Patient is up-to-date on Shingrix and pneumococcal vaccines. The ASCVD Risk score (Arnett DK, et al., 2019) failed to calculate for the following reasons:   Risk score cannot be calculated because patient has a medical history suggesting prior/existing ASCVD  Past Medical History:  Diagnosis Date   Allergy    Anemia    hx child   Anxiety    Arthritis    Atrial fibrillation (HCC)    Blood transfusion without reported diagnosis    Cataract    Complication of anesthesia    long time to get over anesthesia, very belligerant,very small veins per pt   Diabetes mellitus without complication (HCC)    Dysrhythmia    atrial fib episode after lithotripsy 2010. saw dr Rudean Corrente cardiology no visit since   Family history of adverse reaction to anesthesia    belligerant sister   GERD (gastroesophageal reflux disease)    Headache    History of kidney stones    have numerous stones at present   Hypertension    Kidney stone    Pneumonia    hx   PONV (postoperative nausea and vomiting)    Sleep apnea    Stroke [facility])    Thyroid disease    UTI (urinary tract infection)    Past Surgical History:  Procedure Laterality Date   ABDOMINAL HYSTERECTOMY     ANTERIOR LAT LUMBAR FUSION Right 02/22/2015   Procedure: RIGHT Extreme Lateral Interbody Fusion Lumbar one-two, Lumbar two-three;  Surgeon: Isadora Mar, MD;  Location: [facility];  Service: Neurosurgery;  Laterality: Right;   BLADDER SUSPENSION  2012   BREAST SURGERY     COSMETIC SURGERY      DILATION AND CURETTAGE OF UTERUS     EXCISION MORTON'S NEUROMA Left    foot   EYE SURGERY     JOINT REPLACEMENT  2007   rt hip    LAMINECTOMY WITH POSTERIOR LATERAL ARTHRODESIS LEVEL 2  02/22/2015   Procedure: Hemilaminectomy Lumbar two-three Right, Posterior Fusion Lumbar one-two, two-three, Segmental fixation Lumbar one-three ;  Surgeon: Isadora Mar, MD;  Location: [facility];  Service: Neurosurgery;;   LIPOMA EXCISION     back    LITHOTRIPSY     SHOULDER ARTHROSCOPY Right 2014   SPINE SURGERY     TONSILLECTOMY     78 yrs old   TURBINATE RESECTION Bilateral    Dr. Sulema Endo ENT   Social History   Tobacco Use   Smoking status: Former    Current packs/day: 0.00    Average packs/day: 0.5 packs/day for 7.0 years (3.5 ttl pk-yrs)    Types: Cigarettes  Start date: 02/14/1996    Quit date: 02/14/2003    Years since quitting: 20.5   Smokeless tobacco: Never  Vaping Use   Vaping status: Never Used  Substance Use Topics   Alcohol use: Yes    Alcohol/week: 7.0 standard drinks of alcohol    Types: 5 Glasses of wine, 2 Standard drinks or equivalent per week    Comment: daily   Drug use: No   Family History  Problem Relation Age of Onset   Arthritis Mother    COPD Mother    Hearing loss Mother    Heart disease Mother    Miscarriages / [place] Mother    Hearing loss Father    Alcohol abuse Maternal Grandfather    Early death Maternal Grandfather    Diabetes Maternal Grandmother    Early death Paternal Grandfather    Cancer Paternal Grandmother    Arthritis Sister    Asthma Sister    Diabetes Sister    Hearing loss Sister    Diabetes Son    Drug abuse Son    Hypertension Son    Vision loss Paternal Aunt    Allergies  Allergen Reactions   Hydrocodone -Acetaminophen  Other (See Comments) and Hives   Ciprofloxacin Hcl Swelling   Levofloxacin Other (See Comments)    Inflamed blood vessels   Morphine And Codeine Nausea And Vomiting   Talwin [Pentazocine] Other (See  Comments)    hallucinations   Amiodarone    Gentamycin [Gentamicin]    Hydrocodone     Trazodone And Nefazodone Other (See Comments)    nightmares   Amoxicillin  Other (See Comments)    Yeast infection   Tetracyclines & Related Other (See Comments)    Yeast infection   Zanaflex  [Tizanidine  Hcl] Other (See Comments)    fatigue    ROS    Objective:    BP 122/78   Pulse 63   Temp 98.5 F (36.9 C)   Ht 5' 4.02" (1.626 m)   Wt 160 lb (72.6 kg)   SpO2 99%   BMI 27.45 kg/m  BP Readings from Last 3 Encounters:  08/14/23 122/78  01/11/20 (!) 160/80  03/11/15 127/56   Wt Readings from Last 3 Encounters:  08/14/23 160 lb (72.6 kg)  01/11/20 164 lb 8 oz (74.6 kg)  03/11/15 153 lb (69.4 kg)    Physical Exam Vitals and nursing note reviewed.  Constitutional:      Appearance: Normal appearance.  HENT:     Head: Normocephalic.     Right Ear: Tympanic membrane, ear canal and external ear normal.     Left Ear: Tympanic membrane, ear canal and external ear normal.  Eyes:     Extraocular Movements: Extraocular movements intact.     Conjunctiva/sclera: Conjunctivae normal.     Pupils: Pupils are equal, round, and reactive to light.  Cardiovascular:     Rate and Rhythm: Normal rate and regular rhythm.     Heart sounds: Normal heart sounds.  Pulmonary:     Effort: Pulmonary effort is normal.     Breath sounds: Normal breath sounds.  Musculoskeletal:     Right hip: Tenderness and bony tenderness present.     Right lower leg: No edema.     Left lower leg: No edema.     Comments: Right hip- Tender over greater trochanter. Is able to get on exam table.   Neurological:     General: No focal deficit present.     Mental Status: She is alert and  oriented to person, place, and time.  Psychiatric:        Mood and Affect: Mood normal.        Behavior: Behavior normal.        Thought Content: Thought content normal.        Judgment: Judgment normal.      No results found for any  visits on 08/14/23.

## 2023-08-15 LAB — COMPLETE METABOLIC PANEL WITHOUT GFR
AG Ratio: 2 (calc) (ref 1.0–2.5)
ALT: 17 U/L (ref 6–29)
AST: 23 U/L (ref 10–35)
Albumin: 4.4 g/dL (ref 3.6–5.1)
Alkaline phosphatase (APISO): 117 U/L (ref 37–153)
BUN: 19 mg/dL (ref 7–25)
CO2: 26 mmol/L (ref 20–32)
Calcium: 9.4 mg/dL (ref 8.6–10.4)
Chloride: 108 mmol/L (ref 98–110)
Creat: 0.75 mg/dL (ref 0.60–1.00)
Globulin: 2.2 g/dL (ref 1.9–3.7)
Glucose, Bld: 119 mg/dL — ABNORMAL HIGH (ref 65–99)
Potassium: 4.7 mmol/L (ref 3.5–5.3)
Sodium: 143 mmol/L (ref 135–146)
Total Bilirubin: 0.5 mg/dL (ref 0.2–1.2)
Total Protein: 6.6 g/dL (ref 6.1–8.1)

## 2023-08-15 LAB — LIPID PANEL
Cholesterol: 174 mg/dL (ref <200)
HDL: 77 mg/dL (ref >=50)
LDL Cholesterol (Calc): 78 mg/dL
Non-HDL Cholesterol (Calc): 97 mg/dL (ref <130)
Total CHOL/HDL Ratio: 2.3 (calc) (ref <5.0)
Triglycerides: 108 mg/dL (ref <150)

## 2023-08-15 LAB — CBC WITH DIFFERENTIAL/PLATELET
Absolute Lymphocytes: 1890 {cells}/uL (ref 850–3900)
Absolute Monocytes: 630 {cells}/uL (ref 200–950)
Basophils Absolute: 101 {cells}/uL (ref 0–200)
Basophils Relative: 1.6 %
Eosinophils Absolute: 258 {cells}/uL (ref 15–500)
Eosinophils Relative: 4.1 %
HCT: 41.5 % (ref 35.0–45.0)
Hemoglobin: 13.5 g/dL (ref 11.7–15.5)
MCH: 30.6 pg (ref 27.0–33.0)
MCHC: 32.5 g/dL (ref 32.0–36.0)
MCV: 94.1 fL (ref 80.0–100.0)
MPV: 11.2 fL (ref 7.5–12.5)
Monocytes Relative: 10 %
Neutro Abs: 3421 {cells}/uL (ref 1500–7800)
Neutrophils Relative %: 54.3 %
Platelets: 219 10*3/uL (ref 140–400)
RBC: 4.41 10*6/uL (ref 3.80–5.10)
RDW: 12.8 % (ref 11.0–15.0)
Total Lymphocyte: 30 %
WBC: 6.3 10*3/uL (ref 3.8–10.8)

## 2023-08-15 LAB — HEMOGLOBIN A1C
Hgb A1c MFr Bld: 6.5 % — ABNORMAL HIGH (ref <5.7)
Mean Plasma Glucose: 140 mg/dL
eAG (mmol/L): 7.7 mmol/L

## 2023-08-15 LAB — TSH: TSH: 4.1 m[IU]/L (ref 0.40–4.50)

## 2023-08-17 ENCOUNTER — Encounter: Payer: Self-pay | Admitting: Family Medicine

## 2023-11-11 ENCOUNTER — Ambulatory Visit

## 2023-11-11 VITALS — Ht 64.0 in | Wt 160.0 lb

## 2023-11-11 DIAGNOSIS — E785 Hyperlipidemia, unspecified: Secondary | ICD-10-CM

## 2023-11-11 DIAGNOSIS — Z0000 Encounter for general adult medical examination without abnormal findings: Secondary | ICD-10-CM | POA: Diagnosis not present

## 2023-11-11 MED ORDER — ATORVASTATIN CALCIUM 10 MG PO TABS: 10.0000 mg | ORAL_TABLET | Freq: Every day | ORAL | 0 refills | Status: DC

## 2023-11-11 NOTE — Patient Instructions (Signed)
 Connie Barnes , Thank you for taking time out of your busy schedule to complete your Annual Wellness Visit with me. I enjoyed our conversation and look forward to speaking with you again next year. I, as well as your care team,  appreciate your ongoing commitment to your health goals. Please review the following plan we discussed and let me know if I can assist you in the future. Your Game plan/ To Do List    Refill sent in for Atorvastatin    Follow up Visits: Next Medicare AWV with our clinical staff: In 1 year    Have you seen your provider in the last 6 months (3 months if uncontrolled diabetes)? Yes Next Office Visit with your provider: 02/17/24 @ 8:20  Clinician Recommendations:  Aim for 30 minutes of exercise or brisk walking, 6-8 glasses of water, and 5 servings of fruits and vegetables each day.       This is a list of the screening recommended for you and due dates:  Health Maintenance  Topic Date Due   Complete foot exam   Never done   Eye exam for diabetics  Never done   Yearly kidney health urinalysis for diabetes  Never done   COVID-19 Vaccine (6 - 2024-25 season) 12/21/2022   Flu Shot  11/20/2023   Hemoglobin A1C  02/13/2024   Yearly kidney function blood test for diabetes  08/13/2024   Medicare Annual Wellness Visit  11/10/2024   DTaP/Tdap/Td vaccine (3 - Td or Tdap) 08/13/2033   Pneumococcal Vaccine for age over 20  Completed   DEXA scan (bone density measurement)  Completed   Zoster (Shingles) Vaccine  Completed   Hepatitis B Vaccine  Aged Out   HPV Vaccine  Aged Out   Meningitis B Vaccine  Aged Out    Advanced directives: (ACP Link)Information on Advanced Care Planning can be found at [employer] Advance Health Care Directives Advance Health Care Directives. [email or web address]   Advance Care Planning is important because it:  [x]  Makes sure you receive the medical care that is consistent with your values, goals, and preferences  [x]  It provides  guidance to your family and loved ones and reduces their decisional burden about whether or not they are making the right decisions based on your wishes.  Follow the link provided in your after visit summary or read over the paperwork we have mailed to you to help you started getting your Advance Directives in place. If you need assistance in completing these, please reach out to us  so that we can help you!  See attachments for Preventive Care and Fall Prevention Tips.

## 2023-11-11 NOTE — Progress Notes (Signed)
 Subjective:   Connie Barnes is a 78 y.o. who presents for a Medicare Wellness preventive visit.  As a reminder, Annual Wellness Visits don't include a physical exam, and some assessments may be limited, especially if this visit is performed virtually. We may recommend an in-person follow-up visit with your provider if needed.  Visit Complete: Virtual I connected with  Connie Barnes on 11/11/23 by a audio enabled telemedicine application and verified that I am speaking with the correct person using two identifiers.  Patient Location: Home  Provider Location: Home Office  I discussed the limitations of evaluation and management by telemedicine. The patient expressed understanding and agreed to proceed.  Vital Signs: Because this visit was a virtual/telehealth visit, some criteria may be missing or patient reported. Any vitals not documented were not able to be obtained and vitals that have been documented are patient reported.  VideoDeclined- This patient declined Librarian, academic. Therefore the visit was completed with audio only.  Persons Participating in Visit: Patient.  AWV Questionnaire: No: Patient Medicare AWV questionnaire was not completed prior to this visit.  Cardiac Risk Factors include: advanced age (>41men, >83 women);diabetes mellitus;dyslipidemia;hypertension     Objective:    Today's Vitals   11/11/23 1228  Weight: 160 lb (72.6 kg)  Height: 5' 4 (1.626 m)   Body mass index is 27.46 kg/m.     11/11/2023   12:38 PM 01/11/2020   12:33 PM 03/11/2015   10:54 AM 02/23/2015   11:00 AM 02/14/2015    2:15 PM  Advanced Directives  Does Patient Have a Medical Advance Directive? No Yes Yes  Yes  Yes   Type of Merchandiser, retail of [place];Living will   Does patient want to make changes to medical advance directive?     No - Patient declined   Copy of Healthcare Power of Attorney in Chart?     Yes   Would patient  like information on creating a medical advance directive? Yes (MAU/Ambulatory/Procedural Areas - Information given)         Data saved with a previous flowsheet row definition    Current Medications (verified) Outpatient Encounter Medications as of 11/11/2023  Medication Sig   acetaminophen  (TYLENOL ) 500 MG tablet Take 1,000 mg by mouth every 6 (six) hours as needed. pain    atorvastatin  (LIPITOR) 10 MG tablet Take 1 tablet (10 mg total) by mouth daily.   Cholecalciferol (VITAMIN D) 1000 UNITS capsule Take 2,000 Units by mouth daily.    diltiazem (TIAZAC) 180 MG 24 hr capsule Take 180 mg by mouth daily.   estradiol  (ESTRACE ) 0.1 MG/GM vaginal cream Place 1 Applicatorful vaginally 3 (three) times a week.   flecainide (TAMBOCOR) 50 MG tablet Take 50 mg by mouth 2 (two) times daily.   fluticasone (FLONASE) 50 MCG/ACT nasal spray Place 2 sprays into both nostrils daily.   gabapentin  (NEURONTIN ) 600 MG tablet Take 1 tablet (600 mg total) by mouth at bedtime.   Lactobacillus-Inulin (CULTURELLE DIGESTIVE DAILY PO) Take by mouth.   levothyroxine (SYNTHROID) 25 MCG tablet Take 25 mcg by mouth daily before breakfast. 1 1/2 tablets four days a week and 1 tablet three times a week.   methocarbamol  (ROBAXIN ) 500 MG tablet Take 1 tablet (500 mg total) by mouth every 6 (six) hours as needed for muscle spasms.   multivitamin-iron-minerals-folic acid (CENTRUM) chewable tablet Chew 1 tablet by mouth daily.    Polyethyl Glycol-Propyl Glycol (SYSTANE OP)  Place 1 drop into both eyes 2 (two) times daily as needed. Dry eyes    rivaroxaban (XARELTO) 20 MG TABS tablet Take 25 mg by mouth daily with supper.   vitamin B-12 (CYANOCOBALAMIN) 500 MCG tablet Take 500 mcg by mouth daily.   [DISCONTINUED] atorvastatin  (LIPITOR) 10 MG tablet Take 10 mg by mouth daily.   No facility-administered encounter medications on file as of 11/11/2023.    Allergies (verified) Hydrocodone -acetaminophen , Ciprofloxacin hcl,  Levofloxacin, Morphine and codeine, Talwin [pentazocine], Amiodarone, Gentamycin [gentamicin], Hydrocodone , Trazodone and nefazodone, Amoxicillin , Tetracyclines & related, and Zanaflex  [tizanidine  hcl]   History: Past Medical History:  Diagnosis Date   Allergy    Anemia    hx child   Anxiety    Arthritis    Atrial fibrillation (HCC)    Blood transfusion without reported diagnosis    Cataract    Complication of anesthesia    long time to get over anesthesia, very belligerant,very small veins per pt   Diabetes mellitus without complication (HCC)    Dysrhythmia    atrial fib episode after lithotripsy 2010. saw dr josephine leash cardiology no visit since   Family history of adverse reaction to anesthesia    belligerant sister   GERD (gastroesophageal reflux disease)    Headache    History of kidney stones    have numerous stones at present   Hypertension    Kidney stone    Pneumonia    hx   PONV (postoperative nausea and vomiting)    Sleep apnea    Stroke [facility])    Thyroid disease    UTI (urinary tract infection)    Past Surgical History:  Procedure Laterality Date   ABDOMINAL HYSTERECTOMY     ANTERIOR LAT LUMBAR FUSION Right 02/22/2015   Procedure: RIGHT Extreme Lateral Interbody Fusion Lumbar one-two, Lumbar two-three;  Surgeon: Alm GORMAN Molt, MD;  Location: [facility];  Service: Neurosurgery;  Laterality: Right;   BLADDER SUSPENSION  2012   BREAST SURGERY     COSMETIC SURGERY     DILATION AND CURETTAGE OF UTERUS     EXCISION MORTON'S NEUROMA Left    foot   EYE SURGERY     JOINT REPLACEMENT  2007   rt hip    LAMINECTOMY WITH POSTERIOR LATERAL ARTHRODESIS LEVEL 2  02/22/2015   Procedure: Hemilaminectomy Lumbar two-three Right, Posterior Fusion Lumbar one-two, two-three, Segmental fixation Lumbar one-three ;  Surgeon: Alm GORMAN Molt, MD;  Location: [facility];  Service: Neurosurgery;;   LIPOMA EXCISION     back    LITHOTRIPSY     SHOULDER ARTHROSCOPY Right 2014    SPINE SURGERY     TONSILLECTOMY     78 yrs old   TURBINATE RESECTION Bilateral    Dr. Georgina ENT   Family History  Problem Relation Age of Onset   Arthritis Mother    COPD Mother    Hearing loss Mother    Heart disease Mother    Miscarriages / [place] Mother    Hearing loss Father    Alcohol abuse Maternal Grandfather    Early death Maternal Grandfather    Diabetes Maternal Grandmother    Early death Paternal Grandfather    Cancer Paternal Grandmother    Arthritis Sister    Asthma Sister    Diabetes Sister    Hearing loss Sister    Diabetes Son    Drug abuse Son    Hypertension Son    Vision loss Paternal Aunt  Social History   Socioeconomic History   Marital status: Married    Spouse name: Not on file   Number of children: Not on file   Years of education: Not on file   Highest education level: Associate degree: academic program  Occupational History   Not on file  Tobacco Use   Smoking status: Former    Current packs/day: 0.00    Average packs/day: 0.5 packs/day for 7.0 years (3.5 ttl pk-yrs)    Types: Cigarettes    Start date: 02/14/1996    Quit date: 02/14/2003    Years since quitting: 20.7   Smokeless tobacco: Never  Vaping Use   Vaping status: Never Used  Substance and Sexual Activity   Alcohol use: Yes    Alcohol/week: 7.0 standard drinks of alcohol    Types: 5 Glasses of wine, 2 Standard drinks or equivalent per week    Comment: daily   Drug use: No   Sexual activity: Not Currently    Birth control/protection: Post-menopausal  Other Topics Concern   Not on file  Social History Narrative   Patient married to [place]. Pt stays active and enjoys travelling with her husband.    Social Drivers of Corporate investment banker Strain: Low Risk  (11/11/2023)   Overall Financial Resource Strain (CARDIA)    Difficulty of Paying Living Expenses: Not hard at all  Food Insecurity: No Food Insecurity (11/11/2023)   Hunger Vital Sign    Worried About  Running Out of Food in the Last Year: Never true    Ran Out of Food in the Last Year: Never true  Transportation Needs: No Transportation Needs (11/11/2023)   PRAPARE - Administrator, Civil Service (Medical): No    Lack of Transportation (Non-Medical): No  Physical Activity: Insufficiently Active (11/11/2023)   Exercise Vital Sign    Days of Exercise per Week: 4 days    Minutes of Exercise per Session: 30 min  Stress: No Stress Concern Present (11/11/2023)   [employer] of Occupational Health - Occupational Stress Questionnaire    Feeling of Stress: Only a little  Social Connections: Socially Integrated (11/11/2023)   Social Connection and Isolation Panel    Frequency of Communication with Friends and Family: More than three times a week    Frequency of Social Gatherings with Friends and Family: Once a week    Attends Religious Services: More than 4 times per year    Active Member of [employer] or Organizations: Yes    Attends Engineer, structural: More than 4 times per year    Marital Status: Married    Tobacco Counseling Counseling given: Not Answered    Clinical Intake:  Pre-visit preparation completed: Yes  Pain : No/denies pain     Diabetes: No  Lab Results  Component Value Date   HGBA1C 6.5 (H) 08/14/2023     How often do you need to have someone help you when you read instructions, pamphlets, or other written materials from your doctor or pharmacy?: 1 - Never  Interpreter Needed?: No  Information entered by :: Charmaine Bloodgood LPN   Activities of Daily Living     11/11/2023   12:38 PM  In your present state of health, do you have any difficulty performing the following activities:  Hearing? 0  Vision? 0  Difficulty concentrating or making decisions? 0  Walking or climbing stairs? 0  Dressing or bathing? 0  Doing errands, shopping? 0  Preparing Food and eating ?  N  Using the Toilet? N  In the past six months, have you accidently  leaked urine? N  Do you have problems with loss of bowel control? N  Managing your Medications? N  Managing your Finances? N  Housekeeping or managing your Housekeeping? N    Patient Care Team: Aletha Bene, MD as PCP - General (Family Medicine) Marcey Elspeth PARAS, MD as Consulting Physician (Ophthalmology) Steen Evalene CROME., MD (Urology) Bonner Ade, MD as Consulting Physician (Physical Medicine and Rehabilitation) Georgina Alm PHEBE Raddle., MD (Family Medicine) Meldon Lash, MD as Referring Physician (Cardiology) Julieanne Saddie FALCON, AUD (Audiology) Orland Norleen SQUIBB, DPM as Referring Physician (Podiatry) Claudene Loving, MD as Referring Physician (Dermatology) Ladora Gunnar DEL., MD as Referring Physician (Gastroenterology)  I have updated your Care Teams any recent Medical Services you may have received from other providers in the past year.     Assessment:   This is a routine wellness examination for Connie Barnes.  Hearing/Vision screen Hearing Screening - Comments:: Some hearing difficulty; followed by audiology  Vision Screening - Comments:: Wears rx glasses - up to date with routine eye exams with Dr. Marcey    Goals Addressed             This Visit's Progress    Maintain health and independence   On track      Depression Screen     11/11/2023   12:35 PM 08/14/2023    9:11 AM  PHQ 2/9 Scores  PHQ - 2 Score 0 0    Fall Risk     11/11/2023   12:37 PM 08/14/2023    9:11 AM  Fall Risk   Falls in the past year? 0 0  Number falls in past yr: 0 0  Injury with Fall? 0 0  Risk for fall due to : No Fall Risks   Follow up Falls prevention discussed;Education provided;Falls evaluation completed     MEDICARE RISK AT HOME:  Medicare Risk at Home Any stairs in or around the home?: Yes If so, are there any without handrails?: No Home free of loose throw rugs in walkways, pet beds, electrical cords, etc?: Yes Adequate lighting in your home to reduce risk of falls?: Yes Life alert?:  No Use of a cane, walker or w/c?: No Grab bars in the bathroom?: Yes Shower chair or bench in shower?: No Elevated toilet seat or a handicapped toilet?: Yes  TIMED UP AND GO:  Was the test performed?  No  Cognitive Function: Declined/Normal: No cognitive concerns noted by patient or family. Patient alert, oriented, able to answer questions appropriately and recall recent events. No signs of memory loss or confusion.        Immunizations Immunization History  Administered Date(s) Administered   Influenza, High Dose Seasonal PF 02/23/2014, 01/23/2015, 02/26/2017, 01/10/2019, 02/08/2020, 02/07/2021, 04/25/2022, 01/12/2023   Influenza, Seasonal, Injecte, Preservative Fre 02/24/2012   Influenza,inj,Quad PF,6+ Mos 02/14/2016, 01/13/2018   PFIZER Comirnaty(Gray Top)Covid-19 Tri-Sucrose Vaccine 08/31/2020   PFIZER(Purple Top)SARS-COV-2 Vaccination 05/08/2019, 05/28/2019, 02/16/2020   Pfizer Covid-19 Vaccine Bivalent Booster 36yrs & up 03/20/2021   Pneumococcal Conjugate-13 02/14/2016   Pneumococcal Polysaccharide-23 01/29/2012   Tdap 02/24/2012, 08/14/2023   Zoster Recombinant(Shingrix) 08/08/2019, 01/18/2020    Screening Tests Health Maintenance  Topic Date Due   FOOT EXAM  Never done   OPHTHALMOLOGY EXAM  Never done   Diabetic kidney evaluation - Urine ACR  Never done   COVID-19 Vaccine (6 - 2024-25 season) 12/21/2022   INFLUENZA VACCINE  11/20/2023  HEMOGLOBIN A1C  02/13/2024   Diabetic kidney evaluation - eGFR measurement  08/13/2024   Medicare Annual Wellness (AWV)  11/10/2024   DTaP/Tdap/Td (3 - Td or Tdap) 08/13/2033   Pneumococcal Vaccine: 50+ Years  Completed   DEXA SCAN  Completed   Zoster Vaccines- Shingrix  Completed   Hepatitis B Vaccines  Aged Out   HPV VACCINES  Aged Out   Meningococcal B Vaccine  Aged Out    Health Maintenance  Health Maintenance Due  Topic Date Due   FOOT EXAM  Never done   OPHTHALMOLOGY EXAM  Never done   Diabetic kidney evaluation -  Urine ACR  Never done   COVID-19 Vaccine (6 - 2024-25 season) 12/21/2022   Health Maintenance Items Addressed: Requesting records for last diabetic eye exam   Additional Screening:  Vision Screening: Recommended annual ophthalmology exams for early detection of glaucoma and other disorders of the eye. Would you like a referral to an eye doctor? No    Dental Screening: Recommended annual dental exams for proper oral hygiene  Community Resource Referral / Chronic Care Management: CRR required this visit?  No   CCM required this visit?  No   Plan:    I have personally reviewed and noted the following in the patient's chart:   Medical and social history Use of alcohol, tobacco or illicit drugs  Current medications and supplements including opioid prescriptions. Patient is not currently taking opioid prescriptions. Functional ability and status Nutritional status Physical activity Advanced directives List of other physicians Hospitalizations, surgeries, and ER visits in previous 12 months Vitals Screenings to include cognitive, depression, and falls Referrals and appointments  In addition, I have reviewed and discussed with patient certain preventive protocols, quality metrics, and best practice recommendations. A written personalized care plan for preventive services as well as general preventive health recommendations were provided to patient.   Lavelle Pfeiffer [place], [place]   [id number]/[birth year]/7974   After Visit Summary: (MyChart) Due to this being a telephonic visit, the after visit summary with patients personalized plan was offered to patient via MyChart   Notes: Nothing significant to report at this time.

## 2023-12-16 ENCOUNTER — Ambulatory Visit: Admitting: Family Medicine

## 2023-12-22 ENCOUNTER — Ambulatory Visit: Admitting: Family Medicine

## 2024-01-16 ENCOUNTER — Encounter: Payer: Self-pay | Admitting: Family Medicine

## 2024-02-11 NOTE — Progress Notes (Signed)
 Patient in for EKG as ordered.  Patient thinks she is back in Afib.  EKG shows Afib at a rate of 97 BPM.  Patient is taking diltiazem 240 mg 1 capsule every day, flecainide 50 mg 2 tablets twice a day, levothyroxine 25 mcg 1.5 tablets 4 X per week other 3 days 1 tablet per day, and Xarelto 20 mg 1 tablet every day.  Talked with Brittany Cox, NP.  ECV will be offered.  Plan:  Continue all meds as directed.  Patient opted to have ECV ASAP because she is feeling bad.  ECV scheduled for Monday 02/15/24.  Patient had labs drawn today.  Patient was given her instruction sheet and to arrive at hospital by 9 AM.  Patient and family member voiced an understanding of instructions.

## 2024-02-15 ENCOUNTER — Ambulatory Visit: Admitting: Family Medicine

## 2024-02-16 ENCOUNTER — Other Ambulatory Visit: Payer: Self-pay | Admitting: Family Medicine

## 2024-02-16 DIAGNOSIS — E785 Hyperlipidemia, unspecified: Secondary | ICD-10-CM

## 2024-02-17 ENCOUNTER — Encounter: Payer: Self-pay | Admitting: Family Medicine

## 2024-02-17 ENCOUNTER — Ambulatory Visit: Admitting: Family Medicine

## 2024-02-17 VITALS — BP 122/84 | HR 107 | Temp 98.3°F | Ht 64.0 in | Wt 162.0 lb

## 2024-02-17 DIAGNOSIS — I10 Essential (primary) hypertension: Secondary | ICD-10-CM | POA: Diagnosis not present

## 2024-02-17 DIAGNOSIS — E1165 Type 2 diabetes mellitus with hyperglycemia: Secondary | ICD-10-CM | POA: Diagnosis not present

## 2024-02-17 DIAGNOSIS — I480 Paroxysmal atrial fibrillation: Secondary | ICD-10-CM

## 2024-02-17 DIAGNOSIS — E785 Hyperlipidemia, unspecified: Secondary | ICD-10-CM

## 2024-02-17 DIAGNOSIS — Z79899 Other long term (current) drug therapy: Secondary | ICD-10-CM

## 2024-02-17 DIAGNOSIS — E039 Hypothyroidism, unspecified: Secondary | ICD-10-CM | POA: Diagnosis not present

## 2024-02-17 MED ORDER — CLINDAMYCIN HCL 300 MG PO CAPS: 300.0000 mg | ORAL_CAPSULE | ORAL | 1 refills | Status: AC | PRN

## 2024-02-17 NOTE — Progress Notes (Signed)
 Patient Office Visit  Assessment & Plan:  Benign essential HTN -     CBC with Differential/Platelet  Acquired hypothyroidism -     TSH  Hyperlipidemia, unspecified hyperlipidemia type -     Lipid panel  Type 2 diabetes mellitus with hyperglycemia, without long-term current use of insulin (HCC) -     Hemoglobin A1c  Taking a statin medication  Paroxysmal atrial fibrillation (HCC) -     Magnesium  Other orders -     Clindamycin HCl; Take 1 capsule (300 mg total) by mouth as needed. Two hours before seeing dentist  Dispense: 10 capsule; Refill: 1   Assessment and Plan    Paroxysmal atrial fibrillation Recurrent episodes with recent cardioversion. Heart rate fluctuating, currently 107 bpm. Considering ablation if recurrence, prefers newer technique with higher success rate. - Discuss potential ablation with cardiologist if AFib recurs. - Continue diltiazem and Eliquis.  Type 2 diabetes mellitus with hyperglycemia Fluctuating glucose levels, recent elevation noted. - Order A1c test.  Essential hypertension Managed with diltiazem, increased to 360 mg. No constipation issues.  Hyperlipidemia No recent cholesterol check. - Order lipid panel.  Hypothyroidism No recent thyroid function tests. - Order thyroid function tests.  Obstructive sleep apnea Managed with CPAP, effective. Added portable humidifier for throat dryness.          No follow-ups on file.   Subjective:    Patient ID: Connie Barnes, female    DOB: [birth year]  Age: 78 y.o. MRN: [id number]  Chief Complaint  Patient presents with   Medical Management of Chronic Issues    HPI Discussed the use of AI scribe software for clinical note transcription with the patient, who gave verbal consent to proceed.  History of Present Illness        History of Present Illness Connie Barnes is an 78 year old female with atrial fibrillation who presents with recurrence of symptoms and follow up on chronic  medical issues.   She has a history of paroxysmal atrial fibrillation and underwent cardioversion this past Monday. Initially, her heart rate was 66 bpm, but it increased to 107 bpm. She reports that she is back in atrial fibrillation. Her heart rate reached 122 bpm, which she felt, especially at night, causing sleep disturbances. Her current heart rate is 104 bpm, and she describes it as erratic. She experiences slight shortness of breath but can still engage in activities like walking. she has done 2 cardioversions and now the next step will be a cardiac ablation.   She has been on diltiazem, recently increased to 360 mg, which initially caused constipation but is now tolerable. She has been on this increased dose for a couple of months. She also takes clindamycin prophylactically before dental visits due to a hip replacement in 2007.  Recent blood work included kidney function, glucose, and potassium levels, with glucose levels fluctuating since her birthday. She has not had recent cholesterol or A1c checks.  Her social history includes being active, recently caring for her great-grandchild, which provided exercise. 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 She plans to give up playing poker due to the time commitment.  She uses a CPAP machine, which she finds beneficial for sleep, and has recently added a portable humidifier to alleviate throat dryness. She discontinued gabapentin , initially prescribed for pain and sleep, due to concerns about its potential impact on her atrial fibrillation. Patient is  taking 75mg  now and wonders if she can stop it now.   Assessment and Plan Paroxysmal atrial fibrillation Recurrent episodes with recent cardioversion. Heart rate fluctuating, currently 107 bpm. Considering ablation if recurrence, prefers newer technique with higher success rate. - Discuss potential ablation with cardiologist  if AFib recurs. - Continue diltiazem and Eliquis.  Type 2 diabetes mellitus with hyperglycemia Fluctuating glucose levels, recent elevation noted. - Order A1c test.  Essential hypertension Managed with diltiazem, increased to 360 mg. No constipation issues.  Hyperlipidemia No recent cholesterol check. - Order lipid panel.  Hypothyroidism No recent thyroid function tests. - Order thyroid function tests.  Obstructive sleep apnea Managed with CPAP, effective. Added portable humidifier for throat dryness.    The ASCVD Risk score (Arnett DK, et al., 2019) failed to calculate for the following reasons:   The 2019 ASCVD risk score is only valid for ages 39 to 29   Risk score cannot be calculated because patient has a medical history suggesting prior/existing ASCVD  Past Medical History:  Diagnosis Date   Allergy    Anemia    hx child   Anxiety    Arthritis    Atrial fibrillation (HCC)    Blood transfusion without reported diagnosis    Cataract    Complication of anesthesia    long time to get over anesthesia, very belligerant,very small veins per pt   Diabetes mellitus without complication (HCC)    Dysrhythmia    atrial fib episode after lithotripsy 2010. saw dr josephine leash cardiology no visit since   Family history of adverse reaction to anesthesia    belligerant sister   GERD (gastroesophageal reflux disease)    Headache    History of kidney stones    have numerous stones at present   Hypertension    Kidney stone    Pneumonia    hx   PONV (postoperative nausea and vomiting)    Sleep apnea    Stroke [facility])    Thyroid disease    UTI (urinary tract infection)    Past Surgical History:  Procedure Laterality Date   ABDOMINAL HYSTERECTOMY     ANTERIOR LAT LUMBAR FUSION Right 02/22/2015   Procedure: RIGHT Extreme Lateral Interbody Fusion Lumbar one-two, Lumbar two-three;  Surgeon: Alm GORMAN Molt, MD;  Location: [facility];  Service: Neurosurgery;  Laterality:  Right;   BLADDER SUSPENSION  2012   BREAST SURGERY     COSMETIC SURGERY     DILATION AND CURETTAGE OF UTERUS     EXCISION MORTON'S NEUROMA Left    foot   EYE SURGERY     JOINT REPLACEMENT  2007   rt hip    LAMINECTOMY WITH POSTERIOR LATERAL ARTHRODESIS LEVEL 2  02/22/2015   Procedure: Hemilaminectomy Lumbar two-three Right, Posterior Fusion Lumbar one-two, two-three, Segmental fixation Lumbar one-three ;  Surgeon: Alm GORMAN Molt, MD;  Location: [facility];  Service: Neurosurgery;;   LIPOMA EXCISION     back    LITHOTRIPSY     SHOULDER ARTHROSCOPY Right 2014   SPINE SURGERY     TONSILLECTOMY     78 yrs old   TURBINATE RESECTION Bilateral    Dr. Georgina ENT   Social History   Tobacco Use   Smoking status: Former    Current packs/day: 0.00    Average packs/day: 0.5 packs/day for 7.0 years (3.5 ttl pk-yrs)    Types: Cigarettes    Start date: 02/14/1996    Quit date: 02/14/2003    Years  since quitting: 21.0   Smokeless tobacco: Never  Vaping Use   Vaping status: Never Used  Substance Use Topics   Alcohol use: Yes    Alcohol/week: 7.0 standard drinks of alcohol    Types: 5 Glasses of wine, 2 Standard drinks or equivalent per week    Comment: daily   Drug use: No   Family History  Problem Relation Age of Onset   Arthritis Mother    COPD Mother    Hearing loss Mother    Heart disease Mother    Miscarriages / Stillbirths Mother    Hearing loss Father    Alcohol abuse Maternal Grandfather    Early death Maternal Grandfather    Diabetes Maternal Grandmother    Early death Paternal Grandfather    Cancer Paternal Grandmother    Arthritis Sister    Asthma Sister    Diabetes Sister    Hearing loss Sister    Diabetes Son    Drug abuse Son    Hypertension Son    Vision loss Paternal Aunt    Allergies  Allergen Reactions   Hydrocodone -Acetaminophen  Other (See Comments) and Hives   Ciprofloxacin Hcl Swelling   Levofloxacin Other (See Comments)    Inflamed blood  vessels   Morphine And Codeine Nausea And Vomiting   Talwin [Pentazocine] Other (See Comments)    hallucinations   Amiodarone    Gentamycin [Gentamicin]    Hydrocodone     Trazodone And Nefazodone Other (See Comments)    nightmares   Amoxicillin  Other (See Comments)    Yeast infection   Tetracyclines & Related Other (See Comments)    Yeast infection   Zanaflex  [Tizanidine  Hcl] Other (See Comments)    fatigue    ROS    Objective:    BP 122/84   Pulse (!) 107   Temp 98.3 F (36.8 C)   Ht 5' 4 (1.626 m)   Wt 162 lb (73.5 kg)   SpO2 97%   BMI 27.81 kg/m  BP Readings from Last 3 Encounters:  02/17/24 122/84  08/14/23 122/78  01/11/20 (!) 160/80   Wt Readings from Last 3 Encounters:  02/17/24 162 lb (73.5 kg)  11/11/23 160 lb (72.6 kg)  08/14/23 160 lb (72.6 kg)    Physical Exam Vitals and nursing note reviewed.  Constitutional:      General: She is not in acute distress.    Appearance: Normal appearance.     Comments: Comes in with her husband  HENT:     Head: Normocephalic.     Right Ear: Tympanic membrane, ear canal and external ear normal.     Left Ear: Tympanic membrane, ear canal and external ear normal.  Eyes:     Extraocular Movements: Extraocular movements intact.     Pupils: Pupils are equal, round, and reactive to light.  Cardiovascular:     Rate and Rhythm: Tachycardia present. Rhythm irregular.     Heart sounds: Normal heart sounds.  Pulmonary:     Effort: Pulmonary effort is normal.     Breath sounds: Normal breath sounds.  Musculoskeletal:     Right lower leg: No edema.     Left lower leg: No edema.  Neurological:     General: No focal deficit present.     Mental Status: She is alert and oriented to person, place, and time.  Psychiatric:        Mood and Affect: Mood normal.        Behavior: Behavior normal.  Thought Content: Thought content normal.        Judgment: Judgment normal.      No results found for any visits on  02/17/24.

## 2024-02-18 LAB — MAGNESIUM: Magnesium: 2.2 mg/dL (ref 1.5–2.5)

## 2024-02-18 LAB — CBC WITH DIFFERENTIAL/PLATELET
Absolute Lymphocytes: 1908 {cells}/uL (ref 850–3900)
Absolute Monocytes: 763 {cells}/uL (ref 200–950)
Basophils Absolute: 72 {cells}/uL (ref 0–200)
Basophils Relative: 1 %
Eosinophils Absolute: 230 {cells}/uL (ref 15–500)
Eosinophils Relative: 3.2 %
HCT: 45.4 % — ABNORMAL HIGH (ref 35.0–45.0)
Hemoglobin: 15 g/dL (ref 11.7–15.5)
MCH: 31.5 pg (ref 27.0–33.0)
MCHC: 33 g/dL (ref 32.0–36.0)
MCV: 95.4 fL (ref 80.0–100.0)
MPV: 10.9 fL (ref 7.5–12.5)
Monocytes Relative: 10.6 %
Neutro Abs: 4226 {cells}/uL (ref 1500–7800)
Neutrophils Relative %: 58.7 %
Platelets: 216 Thousand/uL (ref 140–400)
RBC: 4.76 Million/uL (ref 3.80–5.10)
RDW: 11.8 % (ref 11.0–15.0)
Total Lymphocyte: 26.5 %
WBC: 7.2 Thousand/uL (ref 3.8–10.8)

## 2024-02-18 LAB — LIPID PANEL
Cholesterol: 165 mg/dL (ref <200)
HDL: 85 mg/dL (ref >=50)
LDL Cholesterol (Calc): 60 mg/dL
Non-HDL Cholesterol (Calc): 80 mg/dL (ref <130)
Total CHOL/HDL Ratio: 1.9 (calc) (ref <5.0)
Triglycerides: 122 mg/dL (ref <150)

## 2024-02-18 LAB — HEMOGLOBIN A1C
Hgb A1c MFr Bld: 6.9 % — ABNORMAL HIGH (ref <5.7)
Mean Plasma Glucose: 151 mg/dL
eAG (mmol/L): 8.4 mmol/L

## 2024-02-18 LAB — TSH: TSH: 4.7 m[IU]/L — ABNORMAL HIGH (ref 0.40–4.50)

## 2024-02-19 ENCOUNTER — Ambulatory Visit: Payer: Self-pay | Admitting: Family Medicine

## 2024-03-11 ENCOUNTER — Encounter: Payer: Self-pay | Admitting: Family Medicine

## 2024-03-14 ENCOUNTER — Other Ambulatory Visit: Payer: Self-pay

## 2024-03-22 ENCOUNTER — Telehealth: Payer: Self-pay | Admitting: Family Medicine

## 2024-03-22 ENCOUNTER — Other Ambulatory Visit: Payer: Self-pay

## 2024-03-22 MED ORDER — ACCU-CHEK SOFTCLIX LANCETS MISC: 12 refills | Status: DC

## 2024-03-22 MED ORDER — ACCU-CHEK GUIDE TEST VI STRP: ORAL_STRIP | 12 refills | Status: DC

## 2024-03-22 NOTE — Telephone Encounter (Unsigned)
 Copied from CRM [id number]. Topic: Clinical - Prescription Issue >> Mar 22, 2024  3:29 PM Tobias CROME wrote: Reason for CRM: [facility] calling to request Rx for Accu-Chek Softclix Lancets lancets to be re sent with frequency. Insurance will not pay for it with use as instructed

## 2024-03-22 NOTE — Telephone Encounter (Signed)
 Resent

## 2024-03-28 ENCOUNTER — Telehealth: Payer: Self-pay

## 2024-03-28 NOTE — Telephone Encounter (Signed)
 Copied from CRM #[id number]. Topic: Clinical - Medication Question >> Mar 28, 2024  4:05 PM Tysheama G wrote: Reason for CRM: Jama from [employer] calling regarding patients glucose blood (ACCU-CHEK GUIDE TEST) test strip; he stated there's no directions on there like the frequency or how often patient should use them or the insurance won't cover them. Can we get that updated asap so they can fill that for patient please.

## 2024-03-29 ENCOUNTER — Other Ambulatory Visit: Payer: Self-pay

## 2024-03-29 MED ORDER — ACCU-CHEK GUIDE TEST VI STRP: ORAL_STRIP | 12 refills | Status: DC

## 2024-03-29 NOTE — Telephone Encounter (Signed)
 Updated Rx

## 2024-04-01 ENCOUNTER — Other Ambulatory Visit: Payer: Self-pay

## 2024-04-01 MED ORDER — ACCU-CHEK GUIDE TEST VI STRP: ORAL_STRIP | 12 refills | Status: AC

## 2024-04-01 MED ORDER — ACCU-CHEK SOFTCLIX LANCETS MISC: 12 refills | Status: AC

## 2024-05-06 LAB — HM MAMMOGRAPHY

## 2024-05-11 ENCOUNTER — Encounter: Payer: Self-pay | Admitting: Family Medicine

## 2024-05-17 ENCOUNTER — Other Ambulatory Visit: Payer: Self-pay | Admitting: Family Medicine

## 2024-05-17 DIAGNOSIS — E785 Hyperlipidemia, unspecified: Secondary | ICD-10-CM

## 2024-08-22 ENCOUNTER — Ambulatory Visit: Admitting: Family Medicine

## 2024-11-16 ENCOUNTER — Ambulatory Visit
